# Patient Record
Sex: Female | Born: 1935 | Race: Black or African American | Hispanic: No | Marital: Single | Smoking: Never smoker
Health system: Southern US, Community
[De-identification: ages and names within clinical notes are randomized; demographics above are authoritative.]

## PROBLEM LIST (undated history)

## (undated) DIAGNOSIS — H53133 Sudden visual loss, bilateral: Secondary | ICD-10-CM

## (undated) DIAGNOSIS — M069 Rheumatoid arthritis, unspecified: Secondary | ICD-10-CM

## (undated) DIAGNOSIS — H409 Unspecified glaucoma: Secondary | ICD-10-CM

## (undated) DIAGNOSIS — I639 Cerebral infarction, unspecified: Secondary | ICD-10-CM

## (undated) DIAGNOSIS — I10 Essential (primary) hypertension: Secondary | ICD-10-CM

## (undated) DIAGNOSIS — I6522 Occlusion and stenosis of left carotid artery: Secondary | ICD-10-CM

## (undated) DIAGNOSIS — F419 Anxiety disorder, unspecified: Secondary | ICD-10-CM

## (undated) DIAGNOSIS — J40 Bronchitis, not specified as acute or chronic: Secondary | ICD-10-CM

## (undated) DIAGNOSIS — R42 Dizziness and giddiness: Secondary | ICD-10-CM

## (undated) DIAGNOSIS — I739 Peripheral vascular disease, unspecified: Secondary | ICD-10-CM

## (undated) DIAGNOSIS — E785 Hyperlipidemia, unspecified: Secondary | ICD-10-CM

## (undated) DIAGNOSIS — E079 Disorder of thyroid, unspecified: Secondary | ICD-10-CM

## (undated) DIAGNOSIS — J449 Chronic obstructive pulmonary disease, unspecified: Secondary | ICD-10-CM

## (undated) DIAGNOSIS — I6340 Cerebral infarction due to embolism of unspecified cerebral artery: Secondary | ICD-10-CM

## (undated) HISTORY — PX: ABDOMINAL HYSTERECTOMY: SHX81

## (undated) HISTORY — PX: BREAST SURGERY: SHX581

---

## 2001-05-27 ENCOUNTER — Encounter: Admission: RE | Admit: 2001-05-27 | Discharge: 2001-05-27 | Payer: Self-pay | Admitting: Internal Medicine

## 2001-05-27 ENCOUNTER — Encounter: Payer: Self-pay | Admitting: Internal Medicine

## 2001-08-26 ENCOUNTER — Encounter: Admission: RE | Admit: 2001-08-26 | Discharge: 2001-08-26 | Payer: Self-pay | Admitting: Internal Medicine

## 2001-08-26 ENCOUNTER — Encounter: Payer: Self-pay | Admitting: Internal Medicine

## 2002-11-19 ENCOUNTER — Encounter: Payer: Self-pay | Admitting: Internal Medicine

## 2002-11-19 ENCOUNTER — Encounter: Admission: RE | Admit: 2002-11-19 | Discharge: 2002-11-19 | Payer: Self-pay | Admitting: Internal Medicine

## 2003-12-09 ENCOUNTER — Encounter: Admission: RE | Admit: 2003-12-09 | Discharge: 2003-12-09 | Payer: Self-pay | Admitting: Internal Medicine

## 2005-01-23 ENCOUNTER — Encounter: Admission: RE | Admit: 2005-01-23 | Discharge: 2005-01-23 | Payer: Self-pay | Admitting: Internal Medicine

## 2006-02-18 ENCOUNTER — Emergency Department (HOSPITAL_COMMUNITY): Admission: EM | Admit: 2006-02-18 | Discharge: 2006-02-19 | Payer: Self-pay | Admitting: Emergency Medicine

## 2011-04-09 ENCOUNTER — Other Ambulatory Visit: Payer: Self-pay | Admitting: Internal Medicine

## 2011-06-06 ENCOUNTER — Ambulatory Visit (INDEPENDENT_AMBULATORY_CARE_PROVIDER_SITE_OTHER): Payer: Medicare Other | Admitting: Emergency Medicine

## 2011-06-06 DIAGNOSIS — I10 Essential (primary) hypertension: Secondary | ICD-10-CM

## 2011-06-06 DIAGNOSIS — E213 Hyperparathyroidism, unspecified: Secondary | ICD-10-CM

## 2011-06-06 DIAGNOSIS — E782 Mixed hyperlipidemia: Secondary | ICD-10-CM

## 2011-06-06 DIAGNOSIS — E785 Hyperlipidemia, unspecified: Secondary | ICD-10-CM

## 2011-06-06 DIAGNOSIS — D34 Benign neoplasm of thyroid gland: Secondary | ICD-10-CM

## 2011-06-06 LAB — POCT CBC
Granulocyte percent: 53.5 %G (ref 37–80)
HCT, POC: 41.9 % (ref 37.7–47.9)
Hemoglobin: 13.5 g/dL (ref 12.2–16.2)
Lymph, poc: 2.6 (ref 0.6–3.4)
MCH, POC: 27.6 pg (ref 27–31.2)
MCHC: 32.2 g/dL (ref 31.8–35.4)
MCV: 85.6 fL (ref 80–97)
MID (cbc): 0.4 (ref 0–0.9)
MPV: 9.3 fL (ref 0–99.8)
POC Granulocyte: 3.5 (ref 2–6.9)
POC LYMPH PERCENT: 39.6 %L (ref 10–50)
POC MID %: 6.9 %M (ref 0–12)
Platelet Count, POC: 201 10*3/uL (ref 142–424)
RBC: 4.89 M/uL (ref 4.04–5.48)
RDW, POC: 16.9 %
WBC: 6.5 10*3/uL (ref 4.6–10.2)

## 2011-06-06 MED ORDER — ATORVASTATIN CALCIUM 80 MG PO TABS: 80.0000 mg | ORAL_TABLET | Freq: Every day | ORAL | Status: DC

## 2011-06-06 MED ORDER — AMLODIPINE BESYLATE 10 MG PO TABS: 10.0000 mg | ORAL_TABLET | Freq: Every day | ORAL | Status: DC

## 2011-06-06 NOTE — Progress Notes (Signed)
  Subjective:    Patient ID: Stacie Bennett, female    DOB: [birth year], 78 y.o.   MRN: [id number]  HPI patient here to followup on her high blood pressure and high cholesterol. She is known to be hyperparathyroid. She was referred to Dr. Juliet Rude but did not have a good experience there. She would prefer to see a different endocrinologist. She states she feels good.    Review of Systems she denies chest pain shortness of breath bowel problems she states she feels fine     Objective:   Physical Exam  Constitutional: She appears well-developed.  HENT:  Head: Normocephalic.  Neck:       Examination of the neck reveals a prominent bony deformity over the manubrium extending to the left. 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  Cardiovascular: Normal rate, regular rhythm and normal heart sounds.  Exam reveals no gallop and no friction rub.   No murmur heard. Pulmonary/Chest: No respiratory distress. She has no wheezes. She has no rales. She exhibits no tenderness.  Abdominal: She exhibits no distension. There is no tenderness. There is no rebound.          Assessment & Plan:  Plan check routine labs. Medicines changed to atorvastatin 80 and amlodipine 10 to save her money. We'll go ahead and make the referral Dr. Everardo All for evaluation of hyperparathyroidism.

## 2011-06-07 ENCOUNTER — Encounter: Payer: Self-pay | Admitting: *Deleted

## 2011-06-07 LAB — COMPREHENSIVE METABOLIC PANEL
ALT: 14 U/L (ref 0–35)
AST: 22 U/L (ref 0–37)
Albumin: 4.3 g/dL (ref 3.5–5.2)
Alkaline Phosphatase: 162 U/L — ABNORMAL HIGH (ref 39–117)
BUN: 12 mg/dL (ref 6–23)
CO2: 28 mEq/L (ref 19–32)
Calcium: 10.5 mg/dL (ref 8.4–10.5)
Chloride: 107 mEq/L (ref 96–112)
Creat: 0.76 mg/dL (ref 0.50–1.10)
Glucose, Bld: 103 mg/dL — ABNORMAL HIGH (ref 70–99)
Potassium: 3.6 mEq/L (ref 3.5–5.3)
Sodium: 144 mEq/L (ref 135–145)
Total Bilirubin: 1.1 mg/dL (ref 0.3–1.2)
Total Protein: 7.1 g/dL (ref 6.0–8.3)

## 2011-06-07 LAB — LIPID PANEL
LDL Cholesterol: 129 mg/dL — ABNORMAL HIGH (ref 0–99)
Triglycerides: 69 mg/dL (ref <150)

## 2011-06-07 LAB — TSH: TSH: 0.861 u[IU]/mL (ref 0.350–4.500)

## 2011-07-01 ENCOUNTER — Ambulatory Visit: Payer: Self-pay | Admitting: Endocrinology

## 2012-06-14 ENCOUNTER — Other Ambulatory Visit: Payer: Self-pay | Admitting: Emergency Medicine

## 2012-07-13 ENCOUNTER — Other Ambulatory Visit: Payer: Self-pay | Admitting: Physician Assistant

## 2012-08-12 ENCOUNTER — Other Ambulatory Visit: Payer: Self-pay | Admitting: Physician Assistant

## 2012-08-24 ENCOUNTER — Other Ambulatory Visit: Payer: Self-pay | Admitting: Emergency Medicine

## 2012-10-02 ENCOUNTER — Other Ambulatory Visit: Payer: Self-pay | Admitting: Emergency Medicine

## 2012-10-02 NOTE — Telephone Encounter (Signed)
Pt was advised at last RF that OV was needed for more. She has appt set up for 01/26/13 but would need 4 RFs to cover her until then. Dr Cleta Alberts do you want to authorize RFs? Pt hasn't been seen since 06/06/11.

## 2012-10-03 MED ORDER — AMLODIPINE BESYLATE 10 MG PO TABS: ORAL_TABLET | ORAL | Status: DC

## 2012-10-03 NOTE — Addendum Note (Signed)
Addended by: Sheppard Plumber A on: 10/03/2012 11:06 AM   Modules accepted: Orders

## 2013-01-16 ENCOUNTER — Other Ambulatory Visit: Payer: Self-pay | Admitting: Emergency Medicine

## 2013-01-26 ENCOUNTER — Encounter: Payer: Medicare Other | Admitting: Emergency Medicine

## 2014-03-02 DIAGNOSIS — Z131 Encounter for screening for diabetes mellitus: Secondary | ICD-10-CM | POA: Diagnosis not present

## 2014-03-02 DIAGNOSIS — I10 Essential (primary) hypertension: Secondary | ICD-10-CM | POA: Diagnosis not present

## 2014-03-02 DIAGNOSIS — M069 Rheumatoid arthritis, unspecified: Secondary | ICD-10-CM | POA: Diagnosis not present

## 2014-03-02 DIAGNOSIS — E784 Other hyperlipidemia: Secondary | ICD-10-CM | POA: Diagnosis not present

## 2014-08-31 DIAGNOSIS — M069 Rheumatoid arthritis, unspecified: Secondary | ICD-10-CM | POA: Diagnosis not present

## 2014-08-31 DIAGNOSIS — E784 Other hyperlipidemia: Secondary | ICD-10-CM | POA: Diagnosis not present

## 2014-08-31 DIAGNOSIS — I10 Essential (primary) hypertension: Secondary | ICD-10-CM | POA: Diagnosis not present

## 2014-08-31 DIAGNOSIS — J42 Unspecified chronic bronchitis: Secondary | ICD-10-CM | POA: Diagnosis not present

## 2014-09-25 ENCOUNTER — Encounter (HOSPITAL_COMMUNITY): Payer: Self-pay

## 2014-09-25 ENCOUNTER — Emergency Department (HOSPITAL_COMMUNITY): Payer: Medicare Other

## 2014-09-25 ENCOUNTER — Emergency Department (HOSPITAL_COMMUNITY)
Admission: EM | Admit: 2014-09-25 | Discharge: 2014-09-26 | Discharge status: Against Medical Advice | Payer: Medicare Other | Attending: Emergency Medicine | Admitting: Emergency Medicine

## 2014-09-25 DIAGNOSIS — Z79899 Other long term (current) drug therapy: Secondary | ICD-10-CM | POA: Diagnosis not present

## 2014-09-25 DIAGNOSIS — J441 Chronic obstructive pulmonary disease with (acute) exacerbation: Secondary | ICD-10-CM | POA: Insufficient documentation

## 2014-09-25 DIAGNOSIS — R0602 Shortness of breath: Secondary | ICD-10-CM

## 2014-09-25 DIAGNOSIS — I10 Essential (primary) hypertension: Secondary | ICD-10-CM | POA: Diagnosis not present

## 2014-09-25 DIAGNOSIS — Z8611 Personal history of tuberculosis: Secondary | ICD-10-CM | POA: Insufficient documentation

## 2014-09-25 DIAGNOSIS — H409 Unspecified glaucoma: Secondary | ICD-10-CM | POA: Insufficient documentation

## 2014-09-25 HISTORY — DX: Essential (primary) hypertension: I10

## 2014-09-25 HISTORY — DX: Chronic obstructive pulmonary disease, unspecified: J44.9

## 2014-09-25 HISTORY — DX: Unspecified glaucoma: H40.9

## 2014-09-25 MED ORDER — ALBUTEROL SULFATE HFA 108 (90 BASE) MCG/ACT IN AERS: 2.0000 | INHALATION_SPRAY | Freq: Four times a day (QID) | RESPIRATORY_TRACT | Status: DC | PRN

## 2014-09-25 NOTE — ED Provider Notes (Signed)
CSN: [id number]     Arrival date & time 09/25/14  2102 History  This chart was scribed for Merryl Hacker, MD by Randa Evens, ED Scribe. This patient was seen in room A03C/A03C and the patient's care was started at 11:35 PM.      Chief Complaint  Patient presents with  . Shortness of Breath   The history is provided by the patient. No language interpreter was used.   HPI Comments: Stacie Bennett is a 81 y.o. female who presents to the Emergency Department complaining of resolved SOB onset 7 Pm tonight. She states that SOB lasted for about 5 minutes. She states that her right lung is collapsed. Pt states that she has a Hx of COPD. Denies any recent travel. Denies CP, cough, fever, leg swelling, n/v/d or other related symptoms. Denies tobacco use. Pt reports Hx of tuberculosis in her early 20's.   Past Medical History  Diagnosis Date  . Hypertension   . Glaucoma   . COPD (chronic obstructive pulmonary disease)    Past Surgical History  Procedure Laterality Date  . Breast surgery    . Abdominal hysterectomy     History reviewed. No pertinent family history. Social History  Substance Use Topics  . Smoking status: Never Smoker   . Smokeless tobacco: None  . Alcohol Use: No   OB History    No data available     Review of Systems  Constitutional: Negative for fever.  Respiratory: Positive for shortness of breath. Negative for cough and chest tightness.   Cardiovascular: Negative for chest pain and leg swelling.  Gastrointestinal: Negative for nausea, vomiting and abdominal pain.  Genitourinary: Negative for dysuria.  Neurological: Negative for headaches.  All other systems reviewed and are negative.     Allergies  Keflex and Pletal  Home Medications   Prior to Admission medications   Medication Sig Start Date End Date Taking? Authorizing Provider  albuterol (PROVENTIL HFA;VENTOLIN HFA) 108 (90 BASE) MCG/ACT inhaler Inhale 2 puffs into the lungs every 6 (six) hours  as needed for wheezing or shortness of breath. 09/25/14   Merryl Hacker, MD  amLODipine (NORVASC) 10 MG tablet TAKE 1 TABLET BY MOUTH EVERY DAY 10/03/12   Darlyne Russian, MD  amLODipine-atorvastatin (CADUET) 10-80 MG per tablet TAKE ONE TABLET BY MOUTH DAILY 04/09/11   Chelle Jeffery, PA-C  atorvastatin (LIPITOR) 80 MG tablet Take 1 tablet (80 mg total) by mouth daily. 06/06/11 06/05/12  Darlyne Russian, MD  latanoprost (XALATAN) 0.005 % ophthalmic solution 1 drop at bedtime.    Historical Provider, MD   BP 177/67 mmHg  Pulse 73  Temp(Src) 98.4 F (36.9 C) (Oral)  Resp 20  SpO2 97%   Physical Exam  Constitutional: She is oriented to person, place, and time. She appears well-developed. No distress.  elderly  HENT:  Head: Normocephalic and atraumatic.  Cardiovascular: Normal rate, regular rhythm and normal heart sounds.   No murmur heard. Pulmonary/Chest: Effort normal. No respiratory distress. She has no wheezes.  Decreased BS Left upper lobe, scarring noted over the left back  Abdominal: Soft. Bowel sounds are normal. There is no tenderness. There is no rebound.  Musculoskeletal: She exhibits no edema.  Neurological: She is alert and oriented to person, place, and time.  Skin: Skin is warm and dry.  Psychiatric: She has a normal mood and affect.  Nursing note and vitals reviewed.   ED Course  Procedures (including critical care time) DIAGNOSTIC STUDIES: Oxygen Saturation is  97% on RA, normal by my interpretation.    COORDINATION OF CARE: 11:49 PM-Discussed treatment plan with pt at bedside and pt agreed to plan.     Labs Review Labs Reviewed  CBC WITH DIFFERENTIAL/PLATELET  BASIC METABOLIC PANEL  TROPONIN I    Imaging Review Dg Chest 2 View  09/25/2014   CLINICAL DATA:  Short of breath beginning tonight lasting for 10 minutes. Tuberculosis at age 28.  EXAM: CHEST  2 VIEW  COMPARISON:  None.  FINDINGS: Examination demonstrates bony deformity of the upper left thorax which may  be related to patient's remote history of tuberculosis treatment. Remainder of the left lung is clear. Right lung is clear. Cardiac silhouette is within normal. There is increased density in the upper right paramediastinal region which may represent a goiter. There are degenerative changes of the spine with mild biphasic curvature of the thoracolumbar spine.  IMPRESSION: No active cardiopulmonary disease.  Chronic bony deformity of the left upper thorax likely related to patient's remote history of treatment for TB.  Increased density over the upper right paramediastinal region likely related to a goiter. Recommend noncontrast chest CT for further evaluation.   Electronically Signed   By: Marin Olp M.D.   On: 09/25/2014 22:04   I have personally reviewed and evaluated these images and lab results as part of my medical decision-making.   EKG Interpretation   Date/Time:  Sunday September 25 2014 21:15:36 EDT Ventricular Rate:  80 PR Interval:  142 QRS Duration: 74 QT Interval:  358 QTC Calculation: 412 R Axis:   63 Text Interpretation:  Normal sinus rhythm Nonspecific T wave abnormality  Abnormal ECG Confirmed by Jessina Marse  MD, Illya Gienger ([id number]) on 09/25/2014  11:32:44 PM      MDM   Final diagnoses:  Shortness of breath    Patient presents with shortness breath. Self-limited and resolved. Patient and her family are upset that she's been waiting for greater than 4 hours. I apologized and discuss with them that the EKG and chest x-ray are initially reassuring. Chest x-ray does show a capacity that needs further CT scan. I discussed with them further workup including troponin to evaluate for heart disease. They do not wish to stay. They're just requesting an inhaler. Currently she is not wheezing and is at her baseline. O2 sats 97% on room air. Patient states understanding that if she leaves without further workup, she has not been fully evaluated for heart strain, PE or any other acute condition.  She understands the risk and benefits including death. She will sign out [place]. Follow-up with primary physician. Patient and family were given a printout of her chest x-ray in order for her to have a CT follow-up.  I personally performed the services described in this documentation, which was scribed in my presence. The recorded information has been reviewed and is accurate.      Merryl Hacker, MD 09/26/14 [phone]

## 2014-09-25 NOTE — ED Notes (Signed)
Onset 7pm pt got shortness of breath that lasted 10 min.  No shortness of breath, chest pain or any other s/s noted at this time.

## 2014-09-26 NOTE — ED Notes (Signed)
Patient left AMA. Son reported that patient unwilling "to wait 2 more hours for blood work." Prescription provided to patient/family for inhaler. Patient transported from ED in wheelchair. No distress noted at time of discharge.

## 2014-10-26 DIAGNOSIS — H43811 Vitreous degeneration, right eye: Secondary | ICD-10-CM | POA: Diagnosis not present

## 2014-10-26 DIAGNOSIS — H4089 Other specified glaucoma: Secondary | ICD-10-CM | POA: Diagnosis not present

## 2014-10-26 DIAGNOSIS — H2511 Age-related nuclear cataract, right eye: Secondary | ICD-10-CM | POA: Diagnosis not present

## 2014-10-26 DIAGNOSIS — H538 Other visual disturbances: Secondary | ICD-10-CM | POA: Diagnosis not present

## 2014-10-28 DIAGNOSIS — H35363 Drusen (degenerative) of macula, bilateral: Secondary | ICD-10-CM | POA: Diagnosis not present

## 2014-11-01 ENCOUNTER — Encounter: Payer: Self-pay | Admitting: Emergency Medicine

## 2014-11-21 DIAGNOSIS — H18413 Arcus senilis, bilateral: Secondary | ICD-10-CM | POA: Diagnosis not present

## 2014-11-21 DIAGNOSIS — H2511 Age-related nuclear cataract, right eye: Secondary | ICD-10-CM | POA: Diagnosis not present

## 2014-11-21 DIAGNOSIS — H401130 Primary open-angle glaucoma, bilateral, stage unspecified: Secondary | ICD-10-CM | POA: Diagnosis not present

## 2014-11-21 DIAGNOSIS — H25011 Cortical age-related cataract, right eye: Secondary | ICD-10-CM | POA: Diagnosis not present

## 2014-11-21 DIAGNOSIS — H4010X0 Unspecified open-angle glaucoma, stage unspecified: Secondary | ICD-10-CM | POA: Diagnosis not present

## 2014-12-01 DIAGNOSIS — E784 Other hyperlipidemia: Secondary | ICD-10-CM | POA: Diagnosis not present

## 2014-12-01 DIAGNOSIS — J42 Unspecified chronic bronchitis: Secondary | ICD-10-CM | POA: Diagnosis not present

## 2014-12-01 DIAGNOSIS — H409 Unspecified glaucoma: Secondary | ICD-10-CM | POA: Diagnosis not present

## 2014-12-01 DIAGNOSIS — I10 Essential (primary) hypertension: Secondary | ICD-10-CM | POA: Diagnosis not present

## 2015-08-10 ENCOUNTER — Other Ambulatory Visit: Payer: Self-pay | Admitting: Internal Medicine

## 2015-08-10 DIAGNOSIS — E2839 Other primary ovarian failure: Secondary | ICD-10-CM

## 2015-08-21 ENCOUNTER — Ambulatory Visit
Admission: RE | Admit: 2015-08-21 | Discharge: 2015-08-21 | Disposition: A | Discharge status: Home / Self Care | Payer: Medicare Other | Source: Ambulatory Visit | Attending: Internal Medicine | Admitting: Internal Medicine

## 2015-08-21 DIAGNOSIS — E2839 Other primary ovarian failure: Secondary | ICD-10-CM

## 2017-06-16 ENCOUNTER — Observation Stay (HOSPITAL_COMMUNITY): Payer: Medicare Other

## 2017-06-16 ENCOUNTER — Encounter (HOSPITAL_COMMUNITY): Payer: Self-pay

## 2017-06-16 ENCOUNTER — Emergency Department (HOSPITAL_COMMUNITY): Payer: Medicare Other

## 2017-06-16 ENCOUNTER — Inpatient Hospital Stay (HOSPITAL_COMMUNITY)
Admission: EM | Admit: 2017-06-16 | Discharge: 2017-06-19 | DRG: 065 | Disposition: A | Discharge status: Home Health | Payer: Medicare Other | Attending: Internal Medicine | Admitting: Internal Medicine

## 2017-06-16 DIAGNOSIS — Z888 Allergy status to other drugs, medicaments and biological substances status: Secondary | ICD-10-CM

## 2017-06-16 DIAGNOSIS — Z90710 Acquired absence of both cervix and uterus: Secondary | ICD-10-CM

## 2017-06-16 DIAGNOSIS — R229 Localized swelling, mass and lump, unspecified: Secondary | ICD-10-CM

## 2017-06-16 DIAGNOSIS — I63412 Cerebral infarction due to embolism of left middle cerebral artery: Secondary | ICD-10-CM

## 2017-06-16 DIAGNOSIS — H543 Unqualified visual loss, both eyes: Secondary | ICD-10-CM | POA: Diagnosis not present

## 2017-06-16 DIAGNOSIS — I10 Essential (primary) hypertension: Secondary | ICD-10-CM | POA: Diagnosis not present

## 2017-06-16 DIAGNOSIS — I6523 Occlusion and stenosis of bilateral carotid arteries: Secondary | ICD-10-CM | POA: Diagnosis present

## 2017-06-16 DIAGNOSIS — R29702 NIHSS score 2: Secondary | ICD-10-CM | POA: Diagnosis present

## 2017-06-16 DIAGNOSIS — F0390 Unspecified dementia without behavioral disturbance: Secondary | ICD-10-CM | POA: Diagnosis present

## 2017-06-16 DIAGNOSIS — I63532 Cerebral infarction due to unspecified occlusion or stenosis of left posterior cerebral artery: Principal | ICD-10-CM | POA: Diagnosis present

## 2017-06-16 DIAGNOSIS — Z8249 Family history of ischemic heart disease and other diseases of the circulatory system: Secondary | ICD-10-CM

## 2017-06-16 DIAGNOSIS — I160 Hypertensive urgency: Secondary | ICD-10-CM | POA: Diagnosis present

## 2017-06-16 DIAGNOSIS — G9340 Encephalopathy, unspecified: Secondary | ICD-10-CM | POA: Diagnosis present

## 2017-06-16 DIAGNOSIS — Z79899 Other long term (current) drug therapy: Secondary | ICD-10-CM

## 2017-06-16 DIAGNOSIS — H53133 Sudden visual loss, bilateral: Secondary | ICD-10-CM | POA: Diagnosis present

## 2017-06-16 DIAGNOSIS — I6522 Occlusion and stenosis of left carotid artery: Secondary | ICD-10-CM | POA: Diagnosis present

## 2017-06-16 DIAGNOSIS — R29706 NIHSS score 6: Secondary | ICD-10-CM | POA: Diagnosis not present

## 2017-06-16 DIAGNOSIS — E785 Hyperlipidemia, unspecified: Secondary | ICD-10-CM | POA: Diagnosis present

## 2017-06-16 DIAGNOSIS — R7989 Other specified abnormal findings of blood chemistry: Secondary | ICD-10-CM

## 2017-06-16 DIAGNOSIS — R29705 NIHSS score 5: Secondary | ICD-10-CM | POA: Diagnosis not present

## 2017-06-16 DIAGNOSIS — F40240 Claustrophobia: Secondary | ICD-10-CM | POA: Diagnosis present

## 2017-06-16 DIAGNOSIS — M069 Rheumatoid arthritis, unspecified: Secondary | ICD-10-CM | POA: Diagnosis present

## 2017-06-16 DIAGNOSIS — Z881 Allergy status to other antibiotic agents status: Secondary | ICD-10-CM

## 2017-06-16 DIAGNOSIS — H547 Unspecified visual loss: Secondary | ICD-10-CM

## 2017-06-16 DIAGNOSIS — I639 Cerebral infarction, unspecified: Secondary | ICD-10-CM | POA: Diagnosis present

## 2017-06-16 DIAGNOSIS — I6340 Cerebral infarction due to embolism of unspecified cerebral artery: Secondary | ICD-10-CM | POA: Diagnosis present

## 2017-06-16 DIAGNOSIS — Z8673 Personal history of transient ischemic attack (TIA), and cerebral infarction without residual deficits: Secondary | ICD-10-CM

## 2017-06-16 DIAGNOSIS — I739 Peripheral vascular disease, unspecified: Secondary | ICD-10-CM | POA: Diagnosis present

## 2017-06-16 DIAGNOSIS — Z6831 Body mass index (BMI) 31.0-31.9, adult: Secondary | ICD-10-CM

## 2017-06-16 DIAGNOSIS — E079 Disorder of thyroid, unspecified: Secondary | ICD-10-CM | POA: Diagnosis present

## 2017-06-16 DIAGNOSIS — J449 Chronic obstructive pulmonary disease, unspecified: Secondary | ICD-10-CM | POA: Diagnosis present

## 2017-06-16 DIAGNOSIS — H53123 Transient visual loss, bilateral: Secondary | ICD-10-CM | POA: Diagnosis present

## 2017-06-16 DIAGNOSIS — H409 Unspecified glaucoma: Secondary | ICD-10-CM | POA: Diagnosis present

## 2017-06-16 DIAGNOSIS — IMO0002 Reserved for concepts with insufficient information to code with codable children: Secondary | ICD-10-CM

## 2017-06-16 DIAGNOSIS — E669 Obesity, unspecified: Secondary | ICD-10-CM | POA: Diagnosis present

## 2017-06-16 HISTORY — DX: Dizziness and giddiness: R42

## 2017-06-16 HISTORY — DX: Occlusion and stenosis of left carotid artery: I65.22

## 2017-06-16 HISTORY — DX: Sudden visual loss, bilateral: H53.133

## 2017-06-16 HISTORY — DX: Hyperlipidemia, unspecified: E78.5

## 2017-06-16 HISTORY — DX: Rheumatoid arthritis, unspecified: M06.9

## 2017-06-16 HISTORY — DX: Anxiety disorder, unspecified: F41.9

## 2017-06-16 HISTORY — DX: Cerebral infarction, unspecified: I63.9

## 2017-06-16 HISTORY — DX: Peripheral vascular disease, unspecified: I73.9

## 2017-06-16 HISTORY — DX: Bronchitis, not specified as acute or chronic: J40

## 2017-06-16 HISTORY — DX: Disorder of thyroid, unspecified: E07.9

## 2017-06-16 HISTORY — DX: Cerebral infarction due to embolism of unspecified cerebral artery: I63.40

## 2017-06-16 LAB — CBC
HCT: 46.5 % — ABNORMAL HIGH (ref 36.0–46.0)
HEMOGLOBIN: 14.5 g/dL (ref 12.0–15.0)
MCH: 27 pg (ref 26.0–34.0)
MCHC: 31.2 g/dL (ref 30.0–36.0)
MCV: 86.6 fL (ref 78.0–100.0)
Platelets: 196 10*3/uL (ref 150–400)
RBC: 5.37 MIL/uL — ABNORMAL HIGH (ref 3.87–5.11)
RDW: 14.3 % (ref 11.5–15.5)
WBC: 6.9 10*3/uL (ref 4.0–10.5)

## 2017-06-16 LAB — COMPREHENSIVE METABOLIC PANEL
ALBUMIN: 3.9 g/dL (ref 3.5–5.0)
ALT: 13 U/L — ABNORMAL LOW (ref 14–54)
AST: 17 U/L (ref 15–41)
Alkaline Phosphatase: 148 U/L — ABNORMAL HIGH (ref 38–126)
Anion gap: 8 (ref 5–15)
BUN: 13 mg/dL (ref 6–20)
CALCIUM: 10.5 mg/dL — AB (ref 8.9–10.3)
CHLORIDE: 105 mmol/L (ref 101–111)
CO2: 27 mmol/L (ref 22–32)
Creatinine, Ser: 1.09 mg/dL — ABNORMAL HIGH (ref 0.44–1.00)
GFR calc non Af Amer: 44 mL/min — ABNORMAL LOW (ref >60)
GFR, EST AFRICAN AMERICAN: 51 mL/min — AB (ref >60)
GLUCOSE: 95 mg/dL (ref 65–99)
POTASSIUM: 4.4 mmol/L (ref 3.5–5.1)
SODIUM: 140 mmol/L (ref 135–145)
Total Bilirubin: 0.9 mg/dL (ref 0.3–1.2)
Total Protein: 7.1 g/dL (ref 6.5–8.1)

## 2017-06-16 LAB — DIFFERENTIAL
Abs Immature Granulocytes: 0 10*3/uL (ref 0.0–0.1)
BASOS ABS: 0 10*3/uL (ref 0.0–0.1)
BASOS PCT: 0 %
EOS ABS: 0.1 10*3/uL (ref 0.0–0.7)
EOS PCT: 2 %
IMMATURE GRANULOCYTES: 0 %
Lymphocytes Relative: 33 %
Lymphs Abs: 2.3 10*3/uL (ref 0.7–4.0)
MONO ABS: 0.6 10*3/uL (ref 0.1–1.0)
Monocytes Relative: 8 %
NEUTROS PCT: 57 %
Neutro Abs: 3.9 10*3/uL (ref 1.7–7.7)

## 2017-06-16 LAB — I-STAT CHEM 8, ED
BUN: 16 mg/dL (ref 6–20)
Calcium, Ion: 1.37 mmol/L (ref 1.15–1.40)
Chloride: 104 mmol/L (ref 101–111)
Creatinine, Ser: 1 mg/dL (ref 0.44–1.00)
Glucose, Bld: 97 mg/dL (ref 65–99)
HCT: 45 % (ref 36.0–46.0)
Hemoglobin: 15.3 g/dL — ABNORMAL HIGH (ref 12.0–15.0)
Potassium: 4.4 mmol/L (ref 3.5–5.1)
SODIUM: 141 mmol/L (ref 135–145)
TCO2: 28 mmol/L (ref 22–32)

## 2017-06-16 LAB — APTT: APTT: 37 s — AB (ref 24–36)

## 2017-06-16 LAB — I-STAT TROPONIN, ED: Troponin i, poc: 0 ng/mL (ref 0.00–0.08)

## 2017-06-16 LAB — CBG MONITORING, ED: GLUCOSE-CAPILLARY: 89 mg/dL (ref 65–99)

## 2017-06-16 LAB — PROTIME-INR
INR: 1.06
Prothrombin Time: 13.7 seconds (ref 11.4–15.2)

## 2017-06-16 LAB — C-REACTIVE PROTEIN

## 2017-06-16 MED ORDER — SODIUM CHLORIDE 0.9 % IV SOLN: INTRAVENOUS | Status: AC

## 2017-06-16 MED ORDER — SENNOSIDES-DOCUSATE SODIUM 8.6-50 MG PO TABS
1.0000 | ORAL_TABLET | Freq: Every evening | ORAL | Status: DC | PRN
  Administered 2017-06-18 (×2): 1 via ORAL
  Filled 2017-06-16 (×2): qty 1

## 2017-06-16 MED ORDER — LABETALOL HCL 5 MG/ML IV SOLN: 5.0000 mg | INTRAVENOUS | Status: DC | PRN

## 2017-06-16 MED ORDER — LORAZEPAM 1 MG PO TABS: 1.0000 mg | ORAL_TABLET | Freq: Once | ORAL | Status: DC

## 2017-06-16 MED ORDER — ASPIRIN 300 MG RE SUPP
300.0000 mg | Freq: Every day | RECTAL | Status: DC
  Administered 2017-06-17: 300 mg via RECTAL
  Filled 2017-06-16: qty 1

## 2017-06-16 MED ORDER — ACETAMINOPHEN 325 MG PO TABS
650.0000 mg | ORAL_TABLET | ORAL | Status: DC | PRN
  Filled 2017-06-16: qty 2

## 2017-06-16 MED ORDER — ACETAMINOPHEN 160 MG/5ML PO SOLN: 650.0000 mg | ORAL | Status: DC | PRN

## 2017-06-16 MED ORDER — MOMETASONE FURO-FORMOTEROL FUM 100-5 MCG/ACT IN AERO
2.0000 | INHALATION_SPRAY | Freq: Every day | RESPIRATORY_TRACT | Status: DC
  Administered 2017-06-18 – 2017-06-19 (×2): 2 via RESPIRATORY_TRACT
  Filled 2017-06-16: qty 8.8

## 2017-06-16 MED ORDER — ENOXAPARIN SODIUM 40 MG/0.4ML ~~LOC~~ SOLN
40.0000 mg | SUBCUTANEOUS | Status: DC
  Administered 2017-06-17 – 2017-06-19 (×3): 40 mg via SUBCUTANEOUS
  Filled 2017-06-16 (×3): qty 0.4

## 2017-06-16 MED ORDER — ASPIRIN 325 MG PO TABS
325.0000 mg | ORAL_TABLET | Freq: Every day | ORAL | Status: DC
  Administered 2017-06-18: 325 mg via ORAL
  Filled 2017-06-16: qty 1

## 2017-06-16 MED ORDER — ALBUTEROL SULFATE (2.5 MG/3ML) 0.083% IN NEBU: 3.0000 mL | INHALATION_SOLUTION | Freq: Four times a day (QID) | RESPIRATORY_TRACT | Status: DC | PRN

## 2017-06-16 MED ORDER — STROKE: EARLY STAGES OF RECOVERY BOOK
Freq: Once | Status: AC
  Administered 2017-06-16
  Filled 2017-06-16: qty 1

## 2017-06-16 MED ORDER — LATANOPROST 0.005 % OP SOLN
1.0000 [drp] | Freq: Every day | OPHTHALMIC | Status: DC
  Administered 2017-06-17 – 2017-06-18 (×2): 1 [drp] via OPHTHALMIC
  Filled 2017-06-16: qty 2.5

## 2017-06-16 MED ORDER — MECLIZINE HCL 12.5 MG PO TABS: 25.0000 mg | ORAL_TABLET | Freq: Three times a day (TID) | ORAL | Status: DC | PRN

## 2017-06-16 MED ORDER — ACETAMINOPHEN 650 MG RE SUPP: 650.0000 mg | RECTAL | Status: DC | PRN

## 2017-06-16 MED ORDER — LORAZEPAM 2 MG/ML IJ SOLN
1.0000 mg | Freq: Once | INTRAMUSCULAR | Status: AC
  Administered 2017-06-16: 1 mg via INTRAVENOUS
  Filled 2017-06-16: qty 1

## 2017-06-16 NOTE — ED Triage Notes (Signed)
PT sent in from ophthalmologist office for stroke work up d/t sudden vision loss in bilateral eyes Friday night. Pt is able to see outline of object if ~ 2 feet in front of her, but can not see anything past that.  Pt son reports headaches for the last couple days, but none currently. No unilateral weakness, slurred speech, facial droop.

## 2017-06-16 NOTE — H&P (Addendum)
History and Physical    Stacie Bennett KGY:[id number] DOB: [birth year] DOA: 06/16/2017  PCP: Darlyne Russian, MD   Patient coming from: Home, by way of ophthalmology clinic  Chief Complaint: Vision loss   HPI: Stacie Bennett is a 84 y.o. female with medical history significant for COPD, cataracts, glaucoma, and hypertension, now presenting to the emergency department for evaluation of bilateral vision loss.  Patient reports that she been in her usual state of health until 06/13/2017 when she developed a mild headache and vision loss involving the bilateral eyes.  Symptoms resolved and she returned to her usual state until yesterday when the vision loss returned.  She denies trauma, eye pain, eye discharge or redness, or similar symptoms previously.  She no longer has a headache and denies any focal numbness or weakness.  She saw her ophthalmologist today for evaluation, reports that her cataracts and glaucoma were said to be stable, and was directed to the ED with concern for stroke.  ED Course: Upon arrival to the ED, patient is found to be afebrile, saturating well on room air, and with vitals otherwise stable.  EKG features a normal sinus rhythm and noncontrast head CT is negative for acute intracranial abnormality.  Chemistry panel features a serum creatinine 1.09 and CBC is unremarkable.  Troponin is undetectable.  MRI brain was ordered but not yet performed and neurology was consulted by the ED physician.  Patient remains hemodynamically stable and will be observed on the telemetry unit for ongoing evaluation and management of bilateral vision loss with concern for possible GCA or stroke.  Review of Systems:  All other systems reviewed and apart from HPI, are negative.  Past Medical History:  Diagnosis Date  . Anxiety   . Bronchitis   . COPD (chronic obstructive pulmonary disease) ([place])   . Glaucoma   . Hyperlipidemia   . Hypertension   . Peripheral vascular disease ([place])   . Rheumatoid  arthritis ([place])   . Vertigo     Past Surgical History:  Procedure Laterality Date  . ABDOMINAL HYSTERECTOMY    . BREAST SURGERY       reports that she has never smoked. She has never used smokeless tobacco. She reports that she does not drink alcohol or use drugs.  Allergies  Allergen Reactions  . Keflex [Cephalexin] Other (See Comments)    Unknown reaction  . Pletal [Cilostazol] Other (See Comments)    Unknown reaction    Family History  Problem Relation Age of Onset  . Hypertension Other      Prior to Admission medications   Medication Sig Start Date End Date Taking? Authorizing Provider  acetaminophen (TYLENOL) 500 MG tablet Take 1,000 mg by mouth every 6 (six) hours as needed for headache.   Yes [provider]  albuterol (PROVENTIL HFA;VENTOLIN HFA) 108 (90 BASE) MCG/ACT inhaler Inhale 2 puffs into the lungs every 6 (six) hours as needed for wheezing or shortness of breath. 09/25/14  Yes Horton, Barbette Hair, MD  amLODipine (NORVASC) 5 MG tablet Take 5 mg by mouth daily. 05/09/17  Yes [provider]  latanoprost (XALATAN) 0.005 % ophthalmic solution Place 1 drop into both eyes at bedtime.    Yes [provider]  meclizine (ANTIVERT) 25 MG tablet Take 25 mg by mouth every 8 (eight) hours as needed for dizziness.   Yes [provider]  mometasone-formoterol (DULERA) 100-5 MCG/ACT AERO Inhale 2 puffs into the lungs daily.   Yes [provider]  Physical Exam: Vitals:   06/16/17 1850 06/16/17 1852 06/16/17 1900 06/16/17 1915  BP: (!) 168/97  (!) 190/80 (!) 177/68  Pulse:  61 62 63  Resp:  '20 20 20  '$ Temp:      TempSrc:      SpO2:  100% 99% 99%      Constitutional: NAD, calm  Eyes: PERTLA, lids and conjunctivae normal ENMT: Mucous membranes are moist. Posterior pharynx clear of any exudate or lesions.   Neck: normal, supple, no masses, no thyromegaly Respiratory: clear to auscultation bilaterally, no wheezing, no  crackles. Normal respiratory effort.    Cardiovascular: S1 & S2 heard, regular rate and rhythm. No extremity edema.  Abdomen: No distension, no tenderness, soft. Bowel sounds normal.  Musculoskeletal: no clubbing / cyanosis. No joint deformity upper and lower extremities.    Skin: no significant rashes, lesions, ulcers. Warm, dry, well-perfused. Neurologic: No facial asymmetry, gross vision loss bilateral. Sensation to light touch intact, patellar DTR normal. Strength 5/5 in all 4 limbs.  Psychiatric:  Alert and oriented to person, place, and situation. Pleasant and cooperative.     Labs on Admission: I have personally reviewed following labs and imaging studies  CBC: Recent Labs  Lab 06/16/17 1633 06/16/17 1706  WBC 6.9  --   NEUTROABS 3.9  --   HGB 14.5 15.3*  HCT 46.5* 45.0  MCV 86.6  --   PLT 196  --    Basic Metabolic Panel: Recent Labs  Lab 06/16/17 1633 06/16/17 1706  NA 140 141  K 4.4 4.4  CL 105 104  CO2 27  --   GLUCOSE 95 97  BUN 13 16  CREATININE 1.09* 1.00  CALCIUM 10.5*  --    GFR: CrCl cannot be calculated (Unknown ideal weight.). Liver Function Tests: Recent Labs  Lab 06/16/17 1633  AST 17  ALT 13*  ALKPHOS 148*  BILITOT 0.9  PROT 7.1  ALBUMIN 3.9   No results for input(s): LIPASE, AMYLASE in the last 168 hours. No results for input(s): AMMONIA in the last 168 hours. Coagulation Profile: Recent Labs  Lab 06/16/17 1633  INR 1.06   Cardiac Enzymes: No results for input(s): CKTOTAL, CKMB, CKMBINDEX, TROPONINI in the last 168 hours. BNP (last 3 results) No results for input(s): PROBNP in the last 8760 hours. HbA1C: No results for input(s): HGBA1C in the last 72 hours. CBG: Recent Labs  Lab 06/16/17 1902  GLUCAP 89   Lipid Profile: No results for input(s): CHOL, HDL, LDLCALC, TRIG, CHOLHDL, LDLDIRECT in the last 72 hours. Thyroid Function Tests: No results for input(s): TSH, T4TOTAL, FREET4, T3FREE, THYROIDAB in the last 72  hours. Anemia Panel: No results for input(s): VITAMINB12, FOLATE, FERRITIN, TIBC, IRON, RETICCTPCT in the last 72 hours. Urine analysis: No results found for: COLORURINE, APPEARANCEUR, LABSPEC, PHURINE, GLUCOSEU, HGBUR, BILIRUBINUR, KETONESUR, PROTEINUR, UROBILINOGEN, NITRITE, LEUKOCYTESUR Sepsis Labs: '@LABRCNTIP'$ (procalcitonin:4,lacticidven:4) )No results found for this or any previous visit (from the past 240 hour(s)).   Radiological Exams on Admission: Ct Head Wo Contrast  Result Date: 06/16/2017 CLINICAL DATA:  84 year old with focal neuro deficit. Sudden visual loss in both eyes. EXAM: CT HEAD WITHOUT CONTRAST TECHNIQUE: Contiguous axial images were obtained from the base of the skull through the vertex without intravenous contrast. COMPARISON:  None. FINDINGS: Brain: Negative for acute hemorrhage, mass lesion, midline shift, hydrocephalus or new large infarct. Atrophy and encephalomalacia in the right temporal lobe. Focal encephalomalacia involving the right parietal and right occipital lobe. Mild ex vacuo dilatation of the  right temporal horn. Vascular: No hyperdense vessel or unexpected calcification. Skull: Normal. Negative for fracture or focal lesion. Sinuses/Orbits: Negative Other: Evidence for a scalp lipoma along the left lateral forehead. IMPRESSION: No acute intracranial abnormality. 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 Electronically Signed   By: Markus Daft M.D.   On: 06/16/2017 17:58    EKG: Independently reviewed. Normal sinus rhythm.   Assessment/Plan   1. Acute bilateral vision loss  - Presents with bilateral vision, had transient vision loss 5/17, but recurred yesterday and has been persistent  - She saw ophthalmology just prior to arrival, reports that cataracts and glaucoma are stable, sent to ED with concern for CVA or GCA - Head CT negative for acute findings  - Continue cardiac monitoring, check MRI brain, MRA head, carotid dopplers, echocardiogram, CRP, ESR, fasting lipids,  and A1c  - Continue frequent neuro checks, PT/OT/SLP eval  - Start ASA    2. COPD  - No wheezing or dyspnea  - Continue ICS/LABA and prn albuterol    3. Hypertension with hypertensive urgency  - BP elevated to 180/100 range in ED  - Hold Norvasc while evaluating for possible acute ischemic CVA, use labetalol IVP's prn    DVT prophylaxis: Lovenox Code Status: Full  Family Communication: Son updated at bedside Consults called: Neurology Admission status: Observation     Vianne Bulls, MD [facility] Pager [phone]  If 7PM-7AM, please contact night-coverage [email or web address] Password [email or web address]  06/16/2017, 9:03 PM

## 2017-06-16 NOTE — Consult Note (Addendum)
Neurology Consultation  Reason for Consult: Blindness both eyes-acute onset Referring Physician: Dr. Myna Hidalgo  CC: Sudden onset bilateral vision loss  History is obtained from: Chart, patient's son at bedside  HPI: Stacie Bennett is a 84 y.o. female past medical history of anxiety, hypertension, hyperlipidemia, peripheral vascular disease, rheumatoid arthritis, moderate dementia per family, was in usual state of health until Friday when she started complaining of a headache all around her head followed by inability to see objects.  The son says that she could not see his face even when it was 2 feet from her.  Patient has since been unable to see anything and do her activities of daily living.  At baseline she is able to navigate herself in the house without a problem and is able to play solitaire on the computer, watch TV and read newspaper without a problem. She was taken to an ophthalmologist, who did not find any acute problems on the eye exam and recommended stroke work-up to evaluate for bilateral posterior circulation strokes. The patient was unable to tolerate the MRI due to severe claustrophobia.  Next line she was very uncooperative during the exam, and the son said that she was very tired from being from the doctor to the ER to the floor all throughout today. She did not really let me examine her completely. Some denied jaw claudication.   LKW: Friday, 06/13/2017 tpa given?: no, unclear if this is a stroke, even if it is, it is outside the window Premorbid modified Rankin scale (mRS): *2  ROS: Patient refused to provide any review of systems  Past Medical History:  Diagnosis Date  . Anxiety   . Bronchitis   . COPD (chronic obstructive pulmonary disease) ([place])   . Glaucoma   . Hyperlipidemia   . Hypertension   . Peripheral vascular disease ([place])   . Rheumatoid arthritis ([place])   . Vertigo      Family History  Problem Relation Age of Onset  . Hypertension Other    social  History:   reports that she has never smoked. She has never used smokeless tobacco. She reports that she does not drink alcohol or use drugs.  Medications  Current Facility-Administered Medications:  .   stroke: mapping our early stages of recovery book, , Does not apply, Once, Opyd, Timothy S, MD .  0.9 %  sodium chloride infusion, , Intravenous, Continuous, Opyd, Ilene Qua, MD .  acetaminophen (TYLENOL) tablet 650 mg, 650 mg, Oral, Q4H PRN **OR** acetaminophen (TYLENOL) solution 650 mg, 650 mg, Per Tube, Q4H PRN **OR** acetaminophen (TYLENOL) suppository 650 mg, 650 mg, Rectal, Q4H PRN, Opyd, Timothy S, MD .  albuterol (PROVENTIL) (2.5 MG/3ML) 0.083% nebulizer solution 3 mL, 3 mL, Inhalation, Q6H PRN, Opyd, Timothy S, MD .  aspirin suppository 300 mg, 300 mg, Rectal, Daily **OR** aspirin tablet 325 mg, 325 mg, Oral, Daily, Opyd, Ilene Qua, MD .  Derrill Memo ON 06/17/2017] enoxaparin (LOVENOX) injection 40 mg, 40 mg, Subcutaneous, Q24H, Opyd, Timothy S, MD .  labetalol (NORMODYNE,TRANDATE) injection 5 mg, 5 mg, Intravenous, Q2H PRN, Opyd, Timothy S, MD .  latanoprost (XALATAN) 0.005 % ophthalmic solution 1 drop, 1 drop, Both Eyes, QHS, Opyd, Timothy S, MD .  meclizine (ANTIVERT) tablet 25 mg, 25 mg, Oral, Q8H PRN, Opyd, Ilene Qua, MD .  Derrill Memo ON 06/17/2017] mometasone-formoterol (DULERA) 100-5 MCG/ACT inhaler 2 puff, 2 puff, Inhalation, Daily, Opyd, Timothy S, MD .  senna-docusate (Senokot-S) tablet 1 tablet, 1 tablet, Oral, QHS PRN, Opyd, Christia Reading  S, MD  Exam: Current vital signs: BP (!) 190/62   Pulse 65   Temp 98.6 F (37 C) (Oral)   Resp 19   Wt 70.4 kg (155 lb 3.3 oz)   SpO2 99%   BMI 31.35 kg/m  Vital signs in last 24 hours: Temp:  [98.6 F (37 C)] 98.6 F (37 C) (05/20 1624) Pulse Rate:  [61-72] 65 (05/20 2000) Resp:  [16-20] 19 (05/20 2000) BP: (168-190)/(62-97) 190/62 (05/20 2000) SpO2:  [98 %-100 %] 99 % (05/20 2000) Weight:  [70.4 kg (155 lb 3.3 oz)] 70.4 kg (155 lb 3.3 oz)  (05/20 2230) General: Patient comfortably sleeping in bed, no acute distress. Gets agitated and aggressive on questioning. H ENT: Normocephalic, atraumatic, clear nares.  On palpating her head she said it hurts everywhere. CVS: Regular rate and rhythm, did not let me auscultate her. Extremities: Warm well perfused Neurological exam Next line patient was sleeping, easily arousable to voice, very agitated and refused to answer questions initially but then did answer some questions. Her speech is clear. She does not know where she has On asking if she had any problems, she said it hurts everywhere. Cranial nerves: Did not let me shine a light in her eyes initially but later was able to check her pupils which were about 5 mm dilated as she had an ophthalmologically examination early this morning, they were reactive and round bilaterally.  She was unable to count fingers and did not blink to threat from either side.  Her face appeared symmetric. Motor exam: She appeared to be moving all her extremities vigorously. Sensory exam: Did not perform Coordination: Did not cooperate for performing   Labs I have reviewed labs in epic and the results pertinent to this consultation are:  CBC    Component Value Date/Time   WBC 6.9 06/16/2017 1633   RBC 5.37 (H) 06/16/2017 1633   HGB 15.3 (H) 06/16/2017 1706   HCT 45.0 06/16/2017 1706   PLT 196 06/16/2017 1633   MCV 86.6 06/16/2017 1633   MCV 85.6 06/06/2011 1459   MCH 27.0 06/16/2017 1633   MCHC 31.2 06/16/2017 1633   RDW 14.3 06/16/2017 1633   LYMPHSABS 2.3 06/16/2017 1633   MONOABS 0.6 06/16/2017 1633   EOSABS 0.1 06/16/2017 1633   BASOSABS 0.0 06/16/2017 1633    CMP     Component Value Date/Time   NA 141 06/16/2017 1706   K 4.4 06/16/2017 1706   CL 104 06/16/2017 1706   CO2 27 06/16/2017 1633   GLUCOSE 97 06/16/2017 1706   BUN 16 06/16/2017 1706   CREATININE 1.00 06/16/2017 1706   CREATININE 0.76 06/06/2011 1445   CALCIUM 10.5 (H)  06/16/2017 1633   CALCIUM 10.4 06/06/2011 1446   PROT 7.1 06/16/2017 1633   ALBUMIN 3.9 06/16/2017 1633   AST 17 06/16/2017 1633   ALT 13 (L) 06/16/2017 1633   ALKPHOS 148 (H) 06/16/2017 1633   BILITOT 0.9 06/16/2017 1633   GFRNONAA 44 (L) 06/16/2017 1633   GFRAA 51 (L) 06/16/2017 1633  Imaging I have reviewed the images obtained: CT scan of the head showed no acute changes, old cerebellar infarct  Assessment:  84 year old woman with past medical history of anxiety, hypertension hyperlipidemia peripheral vascular disease rheumatoid arthritis dementia complaining of bilateral visual loss that started with a headache on Friday. Differentials to consider include stroke versus giant cell arteritis. She was unable to tolerate the MRI as she is extremely claustrophobic.  Might require MRI under sedation  in the morning.   Impression: Evaluate for giant cell arteritis Evaluate for stroke-Has to be a bilateral occipital stroke to have bilateral blindness. If all negative, consider behavioral etiology as well.  Recommendations: Obtain ESR, CRP MRI of the brain CT angiogram head and neck Hemoglobin A1c Fasting lipid panel 2D echocardiogram Telemetry Frequent neurochecks Physical therapy Outpatient therapy speech therapy Consider temporal artery biopsy, if the MRI is negative for stroke and ESR CRP are elevated Also presumptive treatment with high-dose prednisone if ESR and CRP are elevated until the biopsy results come back. We will follow with you.  Patient needs a more thorough exam when she is more cooperative and less agitated.  Neurology will continue to follow.  -- Amie Portland, MD [facility] Pager: [phone] If 7pm to 7am, please call on call as listed on AMION.

## 2017-06-16 NOTE — ED Notes (Signed)
ED Provider at bedside. 

## 2017-06-16 NOTE — ED Provider Notes (Signed)
[facility] EMERGENCY DEPARTMENT Provider Note   CSN: [id number] Arrival date & time: 06/16/17  1520     History   Chief Complaint Chief Complaint  Patient presents with  . Loss of Vision    HPI Stacie Bennett is a 84 y.o. female.  Pt reports bilateral vision loss that started on Friday evening. She has been having intermittent HA but none currently. Went to UC on Saturday and ophthalmology today. Sent here for stroke workup. Per pt and family, ophthalmology exam was normal except for visual acuity testing.  The history is provided by the patient.  Eye Problem   This is a new problem. The current episode started more than 2 days ago. The problem occurs constantly. The problem has not changed since onset.There is a problem in both eyes. There was no injury mechanism. The patient is experiencing no pain. There is no history of trauma to the eye. There is no known exposure to pink eye. She does not wear contacts. Associated symptoms include decreased vision. Pertinent negatives include no nausea and no vomiting. She has tried nothing for the symptoms.    Past Medical History:  Diagnosis Date  . Anxiety   . Bronchitis   . COPD (chronic obstructive pulmonary disease) ([place])   . Glaucoma   . Hyperlipidemia   . Hypertension   . Peripheral vascular disease ([place])   . Rheumatoid arthritis ([place])   . Vertigo     Patient Active Problem List   Diagnosis Date Noted  . COPD (chronic obstructive pulmonary disease) ([place]) 06/16/2017  . Hypertension 06/16/2017  . Vision loss, bilateral 06/16/2017  . Acute loss of vision, bilateral 06/16/2017    Past Surgical History:  Procedure Laterality Date  . ABDOMINAL HYSTERECTOMY    . BREAST SURGERY       OB History   None      Home Medications    Prior to Admission medications   Medication Sig Start Date End Date Taking? Authorizing Provider  acetaminophen (TYLENOL) 500 MG tablet Take 1,000 mg by mouth every 6 (six)  hours as needed for headache.   Yes [provider]  albuterol (PROVENTIL HFA;VENTOLIN HFA) 108 (90 BASE) MCG/ACT inhaler Inhale 2 puffs into the lungs every 6 (six) hours as needed for wheezing or shortness of breath. 09/25/14  Yes Horton, Barbette Hair, MD  amLODipine (NORVASC) 5 MG tablet Take 5 mg by mouth daily. 05/09/17  Yes [provider]  latanoprost (XALATAN) 0.005 % ophthalmic solution Place 1 drop into both eyes at bedtime.    Yes [provider]  meclizine (ANTIVERT) 25 MG tablet Take 25 mg by mouth every 8 (eight) hours as needed for dizziness.   Yes [provider]  mometasone-formoterol (DULERA) 100-5 MCG/ACT AERO Inhale 2 puffs into the lungs daily.   Yes [provider]    Family History Family History  Problem Relation Age of Onset  . Hypertension Other     Social History Social History   Tobacco Use  . Smoking status: Never Smoker  . Smokeless tobacco: Never Used  Substance Use Topics  . Alcohol use: No  . Drug use: No     Allergies   Keflex [cephalexin] and Pletal [cilostazol]   Review of Systems Review of Systems  Constitutional: Negative for chills and fever.  HENT: Negative for ear pain and sore throat.   Eyes: Positive for visual disturbance. Negative for pain.  Respiratory: Negative for cough and shortness of breath.  Cardiovascular: Negative for chest pain and palpitations.  Gastrointestinal: Negative for abdominal pain, nausea and vomiting.  Genitourinary: Negative for dysuria and hematuria.  Musculoskeletal: Negative for arthralgias and back pain.  Skin: Negative for color change and rash.  Neurological: Positive for headaches. Negative for seizures and syncope.  All other systems reviewed and are negative.    Physical Exam Updated Vital Signs BP (!) 190/62   Pulse 65   Temp 98.6 F (37 C) (Oral)   Resp 19   SpO2 99%   Physical Exam  Constitutional: She is oriented to person, place, and time.  She appears well-developed and well-nourished. No distress.  HENT:  Head: Normocephalic and atraumatic.  Eyes: Pupils are equal, round, and reactive to light. Conjunctivae and EOM are normal. Right eye exhibits no discharge. Left eye exhibits no discharge.  Patient appears to have central vision loss bilaterally.  She is able to see hand movement in her periphery of both eyes.  Neck: Neck supple.  Cardiovascular: Normal rate and regular rhythm.  No murmur heard. Pulmonary/Chest: Effort normal and breath sounds normal. No respiratory distress.  Abdominal: Soft. There is no tenderness.  Musculoskeletal: She exhibits no edema.  Neurological: She is alert and oriented to person, place, and time.  Strength is 5 out of 5 in all extremities.  Sensation to light touch intact throughout.  She does have bilateral central vision loss with only hand motion visual acuity in her periphery of bilateral eyes.  Otherwise, no cranial nerve deficit.  Heel-to-shin testing is normal.  Skin: Skin is warm and dry.  Psychiatric: She has a normal mood and affect.  Nursing note and vitals reviewed.    ED Treatments / Results  Labs (all labs ordered are listed, but only abnormal results are displayed) Labs Reviewed  APTT - Abnormal; Notable for the following components:      Result Value   aPTT 37 (*)    All other components within normal limits  CBC - Abnormal; Notable for the following components:   RBC 5.37 (*)    HCT 46.5 (*)    All other components within normal limits  COMPREHENSIVE METABOLIC PANEL - Abnormal; Notable for the following components:   Creatinine, Ser 1.09 (*)    Calcium 10.5 (*)    ALT 13 (*)    Alkaline Phosphatase 148 (*)    GFR calc non Af Amer 44 (*)    GFR calc Af Amer 51 (*)    All other components within normal limits  I-STAT CHEM 8, ED - Abnormal; Notable for the following components:   Hemoglobin 15.3 (*)    All other components within normal limits  PROTIME-INR    DIFFERENTIAL  HEMOGLOBIN A1C  LIPID PANEL  SEDIMENTATION RATE  C-REACTIVE PROTEIN  BASIC METABOLIC PANEL  I-STAT TROPONIN, ED  CBG MONITORING, ED    EKG EKG Interpretation  Date/Time:  Monday Jun 16 2017 16:33:21 EDT Ventricular Rate:  69 PR Interval:  140 QRS Duration: 70 QT Interval:  414 QTC Calculation: 443 R Axis:   72 Text Interpretation:  Normal sinus rhythm Septal infarct , age undetermined Abnormal ECG No significant change since last tracing Confirmed by Merrily Pew [id number]) on 06/16/2017 9:44:58 PM   Radiology Ct Head Wo Contrast  Result Date: 06/16/2017 CLINICAL DATA:  84 year old with focal neuro deficit. Sudden visual loss in both eyes. EXAM: CT HEAD WITHOUT CONTRAST TECHNIQUE: Contiguous axial images were obtained from the base of the skull through the vertex without intravenous contrast.  COMPARISON:  None. FINDINGS: Brain: Negative for acute hemorrhage, mass lesion, midline shift, hydrocephalus or new large infarct. Atrophy and encephalomalacia in the right temporal lobe. Focal encephalomalacia involving the right parietal and right occipital lobe. Mild ex vacuo dilatation of the right temporal horn. Vascular: No hyperdense vessel or unexpected calcification. Skull: Normal. Negative for fracture or focal lesion. Sinuses/Orbits: Negative Other: Evidence for a scalp lipoma along the left lateral forehead. IMPRESSION: No acute intracranial abnormality. 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 Electronically Signed   By: Markus Daft M.D.   On: 06/16/2017 17:58    Procedures Procedures (including critical care time)  Medications Ordered in ED Medications  albuterol (PROVENTIL) (2.5 MG/3ML) 0.083% nebulizer solution 3 mL (has no administration in time range)  latanoprost (XALATAN) 0.005 % ophthalmic solution 1 drop (has no administration in time range)  meclizine (ANTIVERT) tablet 25 mg (has no administration in time range)  mometasone-formoterol (DULERA) 100-5 MCG/ACT inhaler 2  puff (has no administration in time range)   stroke: mapping our early stages of recovery book (has no administration in time range)  0.9 %  sodium chloride infusion (has no administration in time range)  acetaminophen (TYLENOL) tablet 650 mg (has no administration in time range)    Or  acetaminophen (TYLENOL) solution 650 mg (has no administration in time range)    Or  acetaminophen (TYLENOL) suppository 650 mg (has no administration in time range)  senna-docusate (Senokot-S) tablet 1 tablet (has no administration in time range)  enoxaparin (LOVENOX) injection 40 mg (has no administration in time range)  aspirin suppository 300 mg (has no administration in time range)    Or  aspirin tablet 325 mg (has no administration in time range)  labetalol (NORMODYNE,TRANDATE) injection 5 mg (has no administration in time range)  LORazepam (ATIVAN) injection 1 mg (1 mg Intravenous Given 06/16/17 2053)     Initial Impression / Assessment and Plan / ED Course  I have reviewed the triage vital signs and the nursing notes.  Pertinent labs & imaging results that were available during my care of the patient were reviewed by me and considered in my medical decision making (see chart for details).    Patient is an 84 year old female with history as above who presents due to vision loss bilaterally since Friday.  She was having some headaches on Friday and Saturday, these have resolved.  She saw her ophthalmologist today.  She had per report a normal dilated retinal exam and normal eye pressures.  She currently denies any pain.  She was sent here due to concern for possible stroke.  On exam, she does have bilateral central vision loss.  She also has significantly decreased vision in her peripheral, now only visual acuity for hand motion.  The remainder of her neurologic exam is nonfocal.  Prior to this on Friday, she was typically able to see well enough to use a computer.  CT of her head shows no acute  abnormality.  MRI has been ordered.  Hospitalist consulted for admission.  MRI pending.  Final Clinical Impressions(s) / ED Diagnoses   Final diagnoses:  Visual loss    ED Discharge Orders    None       Clifton James, MD 06/16/17 2248    Merrily Pew, MD 06/16/17 2342

## 2017-06-16 NOTE — Progress Notes (Signed)
Patient appears to be over whelmed from the days activity, she refusing all care and efforts to attach doplper and or tellemetry machines for her Q2 neuro and vital signs, spoke with Dr Rory Percy and he agrees that we should not force her and wait until she is calmer. Will continue to monitor top the best of my ability throughout my shift.

## 2017-06-16 NOTE — Progress Notes (Addendum)
Patient arrived from ED/MRI around 2215, she was given 1 mg of ativan to calm her for MRI, which was unsuccessfull. She is still very scared and given her poor vision she is refusing care and at this time I am not able to attach monitor leads or assess her she does not want to be touched. Will continue to monitor.  Family now says patient has dementia Dr. Rory Percy is in room attempting to asses patient.

## 2017-06-16 NOTE — ED Provider Notes (Signed)
I saw and evaluated the patient, reviewed the resident's note and I agree with the findings and plan with the following exceptions.   Acute onset of binocular vision loss approximately 4 days ago saw Dr. Domingo Madeira ophthalmologist who recommended MRI to evaluate for stroke.  Patient still with severe bilateral vision loss.  On my exam her neurologic exam is otherwise unremarkable I deferred ambulation however fundus exam is normal bilateral eyes.  Plan will be to get MRI, admission for stroke work-up.   EKG Interpretation  Date/Time:  Monday Jun 16 2017 16:33:21 EDT Ventricular Rate:  69 PR Interval:  140 QRS Duration: 70 QT Interval:  414 QTC Calculation: 443 R Axis:   72 Text Interpretation:  Normal sinus rhythm Septal infarct , age undetermined Abnormal ECG No significant change since last tracing Confirmed by Merrily Pew [phone]) on 06/16/2017 9:44:58 PM         Alon Mazor, Corene Cornea, MD 06/16/17 2343

## 2017-06-17 ENCOUNTER — Encounter (HOSPITAL_COMMUNITY): Payer: Self-pay | Admitting: Radiology

## 2017-06-17 ENCOUNTER — Observation Stay (HOSPITAL_COMMUNITY): Payer: Medicare Other

## 2017-06-17 ENCOUNTER — Observation Stay (HOSPITAL_COMMUNITY): Payer: Medicare Other | Admitting: Anesthesiology

## 2017-06-17 ENCOUNTER — Encounter (HOSPITAL_COMMUNITY)
Admission: EM | Disposition: A | Discharge status: Home Health | Payer: Self-pay | Source: Home / Self Care | Attending: Internal Medicine

## 2017-06-17 ENCOUNTER — Other Ambulatory Visit (HOSPITAL_COMMUNITY): Payer: Medicare Other

## 2017-06-17 DIAGNOSIS — I63113 Cerebral infarction due to embolism of bilateral vertebral arteries: Secondary | ICD-10-CM | POA: Diagnosis not present

## 2017-06-17 DIAGNOSIS — J449 Chronic obstructive pulmonary disease, unspecified: Secondary | ICD-10-CM | POA: Diagnosis not present

## 2017-06-17 DIAGNOSIS — H543 Unqualified visual loss, both eyes: Secondary | ICD-10-CM | POA: Diagnosis not present

## 2017-06-17 DIAGNOSIS — H53133 Sudden visual loss, bilateral: Secondary | ICD-10-CM | POA: Diagnosis not present

## 2017-06-17 DIAGNOSIS — I10 Essential (primary) hypertension: Secondary | ICD-10-CM | POA: Diagnosis not present

## 2017-06-17 DIAGNOSIS — E782 Mixed hyperlipidemia: Secondary | ICD-10-CM | POA: Diagnosis not present

## 2017-06-17 DIAGNOSIS — I63412 Cerebral infarction due to embolism of left middle cerebral artery: Secondary | ICD-10-CM | POA: Diagnosis not present

## 2017-06-17 DIAGNOSIS — I6522 Occlusion and stenosis of left carotid artery: Secondary | ICD-10-CM | POA: Diagnosis not present

## 2017-06-17 DIAGNOSIS — R7989 Other specified abnormal findings of blood chemistry: Secondary | ICD-10-CM | POA: Diagnosis not present

## 2017-06-17 DIAGNOSIS — E079 Disorder of thyroid, unspecified: Secondary | ICD-10-CM | POA: Diagnosis not present

## 2017-06-17 HISTORY — PX: RADIOLOGY WITH ANESTHESIA: SHX6223

## 2017-06-17 LAB — LIPID PANEL
CHOLESTEROL: 228 mg/dL — AB (ref 0–200)
HDL: 56 mg/dL (ref >40)
LDL Cholesterol: 162 mg/dL — ABNORMAL HIGH (ref 0–99)
Total CHOL/HDL Ratio: 4.1 RATIO
Triglycerides: 49 mg/dL (ref <150)
VLDL: 10 mg/dL (ref 0–40)

## 2017-06-17 LAB — BASIC METABOLIC PANEL
Anion gap: 8 (ref 5–15)
BUN: 11 mg/dL (ref 6–20)
CHLORIDE: 107 mmol/L (ref 101–111)
CO2: 25 mmol/L (ref 22–32)
CREATININE: 0.87 mg/dL (ref 0.44–1.00)
Calcium: 9.8 mg/dL (ref 8.9–10.3)
GFR calc Af Amer: >60 mL/min (ref >60)
GFR calc non Af Amer: 58 mL/min — ABNORMAL LOW (ref >60)
Glucose, Bld: 93 mg/dL (ref 65–99)
Potassium: 4 mmol/L (ref 3.5–5.1)
SODIUM: 140 mmol/L (ref 135–145)

## 2017-06-17 LAB — HEMOGLOBIN A1C
HEMOGLOBIN A1C: 5.6 % (ref 4.8–5.6)
Mean Plasma Glucose: 114.02 mg/dL

## 2017-06-17 LAB — SEDIMENTATION RATE: SED RATE: 9 mm/h (ref 0–22)

## 2017-06-17 SURGERY — RADIOLOGY WITH ANESTHESIA
Anesthesia: General

## 2017-06-17 MED ORDER — STROKE: EARLY STAGES OF RECOVERY BOOK: Freq: Once | Status: DC

## 2017-06-17 MED ORDER — LIDOCAINE HCL (CARDIAC) PF 100 MG/5ML IV SOSY
PREFILLED_SYRINGE | INTRAVENOUS | Status: DC | PRN
  Administered 2017-06-17: 80 mg via INTRAVENOUS

## 2017-06-17 MED ORDER — FENTANYL CITRATE (PF) 100 MCG/2ML IJ SOLN
INTRAMUSCULAR | Status: DC | PRN
  Administered 2017-06-17: 25 ug via INTRAVENOUS

## 2017-06-17 MED ORDER — DEXAMETHASONE SODIUM PHOSPHATE 10 MG/ML IJ SOLN
INTRAMUSCULAR | Status: DC | PRN
  Administered 2017-06-17: 4 mg via INTRAVENOUS

## 2017-06-17 MED ORDER — ONDANSETRON HCL 4 MG/2ML IJ SOLN
INTRAMUSCULAR | Status: DC | PRN
  Administered 2017-06-17: 4 mg via INTRAVENOUS

## 2017-06-17 MED ORDER — LACTATED RINGERS IV SOLN
INTRAVENOUS | Status: DC
  Administered 2017-06-17: 12:00:00 via INTRAVENOUS

## 2017-06-17 MED ORDER — EPHEDRINE SULFATE 50 MG/ML IJ SOLN
INTRAMUSCULAR | Status: DC | PRN
  Administered 2017-06-17: 5 mg via INTRAVENOUS

## 2017-06-17 MED ORDER — CLOPIDOGREL BISULFATE 75 MG PO TABS
75.0000 mg | ORAL_TABLET | Freq: Every day | ORAL | Status: DC
  Administered 2017-06-18 – 2017-06-19 (×3): 75 mg via ORAL
  Filled 2017-06-17 (×3): qty 1

## 2017-06-17 MED ORDER — ATORVASTATIN CALCIUM 80 MG PO TABS
80.0000 mg | ORAL_TABLET | Freq: Every day | ORAL | Status: DC
  Administered 2017-06-17 – 2017-06-18 (×2): 80 mg via ORAL
  Filled 2017-06-17 (×2): qty 1

## 2017-06-17 MED ORDER — PROPOFOL 10 MG/ML IV BOLUS
INTRAVENOUS | Status: DC | PRN
  Administered 2017-06-17: 100 mg via INTRAVENOUS

## 2017-06-17 NOTE — Transfer of Care (Signed)
Immediate Anesthesia Transfer of Care Note  Patient: Stacie Bennett  Procedure(s) Performed: RADIOLOGY WITH ANESTHESIA (N/A )  Patient Location: PACU  Anesthesia Type:General  Level of Consciousness: awake and drowsy  Airway & Oxygen Therapy: Patient Spontanous Breathing and Patient connected to nasal cannula oxygen  Post-op Assessment: Report given to RN, Post -op Vital signs reviewed and stable and Patient moving all extremities X 4  Post vital signs: Reviewed and stable  Last Vitals:  Vitals Value Taken Time  BP 146/63 06/17/2017  2:05 PM  Temp 36.9 C 06/17/2017  2:05 PM  Pulse 75 06/17/2017  2:14 PM  Resp 15 06/17/2017  2:14 PM  SpO2 98 % 06/17/2017  2:14 PM  Vitals shown include unvalidated device data.  Last Pain:  Vitals:   06/17/17 1405  TempSrc:   PainSc: 0-No pain         Complications: No apparent anesthesia complications

## 2017-06-17 NOTE — Progress Notes (Signed)
Patients son came back this morning and has had a calm conversation with the patient who has obvious issues with dementia which was possible exacerbated by the ativan she was given. Having slept and no longer feeling the effects of the ativan she says that her vision is better I did assess her pupils and she is showing some reaction to impending movement to her eyes and fields of gaze are somewhat intact.  Eyes are light reactive and pupils are reacting a little better than sluggish but not brisk. She has been NPO since her arrival on this floor, in hopes of having a heavily MRI done under sedation.

## 2017-06-17 NOTE — Progress Notes (Signed)
PT Cancellation Note  Patient Details Name: Stacie Bennett MRN: [id number] DOB: 18-Jun-1927   Cancelled Treatment:    Reason Eval/Treat Not Completed: Patient at procedure or test/unavailable   Duncan Dull 06/17/2017, 12:12 PM

## 2017-06-17 NOTE — Progress Notes (Signed)
Progress Note    Stacie Bennett  XVQ:[id number] DOB: [birth year]  DOA: 06/16/2017 PCP: Darlyne Russian, MD    Brief Narrative:   Chief complaint: Bilateral vision loss.  Medical records reviewed and are as summarized below:  Stacie Bennett is an 84 y.o. female with a PMH of COPD, cataracts, glaucoma, rheumatoid arthritis, dementia and hypertension who was admitted 06/16/2017 for evaluation of bilateral vision loss associated with mild headache.  Evaluated by her ophthalmologist who directed her to the ED out of concern for stroke.  Initial CT negative for acute intracranial abnormality.  Assessment/Plan:   Principal Problem:   Vision loss, bilateral: Rule out stroke versus giant cell arteritis Initial CT personally reviewed and is negative for acute findings.  Unable to tolerate MRI due to claustrophobia.  Evaluated by neurologist with full stroke work-up and rheumatologic work-up for giant cell arteritis ordered.  ESR 9, CRP less than 0.8, not consistent with giant cell arteritis.  Hemoglobin A1c 5.6%.  Lipid panel pending.  Will need MRI under sedation.  Continue aspirin.  Follow-up CT angiogram head and neck, 2D echo, further neurologic recommendations.  Active Problems:   COPD (chronic obstructive pulmonary disease) ([place]) Continue Dulera and as needed albuterol.    Hypertension Continue labetalol as needed.  Norvasc on hold to allow permissive hypertension.     Obesity Body mass index is 31.35 kg/m.   Family Communication/Anticipated D/C date and plan/Code Status   DVT prophylaxis: Lovenox ordered. Code Status: Full Code.  Family Communication: Multiple family updated at bedside. Disposition Plan: Home in next 24 hours after work up completed.   Medical Consultants:    Neurology.   Anti-Infectives:    None  Subjective:   Reports some improvement in vision. Able to see fingers held up. No headache or other complaints.  Objective:    Vitals:   06/16/17  2000 06/16/17 2230 06/17/17 0630 06/17/17 0804  BP: (!) 190/62  (!) 173/72 (!) 148/52  Pulse: 65  70 68  Resp: '19  18 15  '$ Temp:   98.3 F (36.8 C)   TempSrc:   Oral Oral  SpO2: 99%  99% 100%  Weight:  70.4 kg (155 lb 3.3 oz)     No intake or output data in the 24 hours ending 06/17/17 1115 Filed Weights   06/16/17 2230  Weight: 70.4 kg (155 lb 3.3 oz)    Exam: General: No acute distress. Cardiovascular: Heart sounds show a regular rate, and rhythm. No gallops or rubs. No murmurs. No JVD. Lungs: Clear to auscultation bilaterally with good air movement. No rales, rhonchi or wheezes. Abdomen: Soft, nontender, nondistended with normal active bowel sounds. No masses. No hepatosplenomegaly. Neurological: Alert, PEERL (possibly slightly sluggish pupillary response on left compared to right), EOMI, able to see grossly. Skin: Warm and dry. No rashes or lesions. Extremities: No clubbing or cyanosis. No edema. Pedal pulses 2+. Psychiatric: Mood and affect are normal. Insight and judgment are fair.   Data Reviewed:   I have personally reviewed following labs and imaging studies:  Labs: Labs show the following:   Basic Metabolic Panel: Recent Labs  Lab 06/16/17 1633 06/16/17 1706 06/17/17 0623  NA 140 141 140  K 4.4 4.4 4.0  CL 105 104 107  CO2 27  --  25  GLUCOSE 95 97 93  BUN '13 16 11  '$ CREATININE 1.09* 1.00 0.87  CALCIUM 10.5*  --  9.8   GFR CrCl cannot be calculated (Unknown ideal weight.). Liver  Function Tests: Recent Labs  Lab 06/16/17 1633  AST 17  ALT 13*  ALKPHOS 148*  BILITOT 0.9  PROT 7.1  ALBUMIN 3.9   Coagulation profile Recent Labs  Lab 06/16/17 1633  INR 1.06    CBC: Recent Labs  Lab 06/16/17 1633 06/16/17 1706  WBC 6.9  --   NEUTROABS 3.9  --   HGB 14.5 15.3*  HCT 46.5* 45.0  MCV 86.6  --   PLT 196  --    CBG: Recent Labs  Lab 06/16/17 1902  GLUCAP 89   Hgb A1c: Recent Labs    06/17/17 0623  HGBA1C 5.6    Microbiology No  results found for this or any previous visit (from the past 240 hour(s)).  Procedures and diagnostic studies:  Ct Head Wo Contrast  Result Date: 06/16/2017 CLINICAL DATA:  84 year old with focal neuro deficit. Sudden visual loss in both eyes. EXAM: CT HEAD WITHOUT CONTRAST TECHNIQUE: Contiguous axial images were obtained from the base of the skull through the vertex without intravenous contrast. COMPARISON:  None. FINDINGS: Brain: Negative for acute hemorrhage, mass lesion, midline shift, hydrocephalus or new large infarct. Atrophy and encephalomalacia in the right temporal lobe. Focal encephalomalacia involving the right parietal and right occipital lobe. Mild ex vacuo dilatation of the right temporal horn. Vascular: No hyperdense vessel or unexpected calcification. Skull: Normal. Negative for fracture or focal lesion. Sinuses/Orbits: Negative Other: Evidence for a scalp lipoma along the left lateral forehead. IMPRESSION: No acute intracranial abnormality. 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 Electronically Signed   By: Markus Daft M.D.   On: 06/16/2017 17:58    Medications:   . [MAR Hold] aspirin  300 mg Rectal Daily   Or  . [MAR Hold] aspirin  325 mg Oral Daily  . [MAR Hold] enoxaparin (LOVENOX) injection  40 mg Subcutaneous Q24H  . [MAR Hold] latanoprost  1 drop Both Eyes QHS  . [MAR Hold] mometasone-formoterol  2 puff Inhalation Daily   Continuous Infusions:   LOS: 0 days   Jacquelynn Cree  [facility] Pager [phone]. If unable to reach me by pager, please call my cell phone at [phone].  *Please refer to [email or web address], password [email or web address] to get updated schedule on who will round on this patient, as hospitalists switch teams weekly. If 7PM-7AM, please contact night-coverage at [email or web address], password [email or web address] for any overnight needs.  06/17/2017, 11:15 AM

## 2017-06-17 NOTE — Anesthesia Postprocedure Evaluation (Signed)
Anesthesia Post Note  Patient: Stacie Bennett  Procedure(s) Performed: RADIOLOGY WITH ANESTHESIA (N/A )     Patient location during evaluation: PACU Anesthesia Type: General Level of consciousness: awake and alert Pain management: pain level controlled Vital Signs Assessment: post-procedure vital signs reviewed and stable Respiratory status: spontaneous breathing, nonlabored ventilation and respiratory function stable Cardiovascular status: blood pressure returned to baseline and stable Postop Assessment: no apparent nausea or vomiting Anesthetic complications: no    Last Vitals:  Vitals:   06/17/17 0804 06/17/17 1405  BP: (!) 148/52 (!) 146/63  Pulse: 68 83  Resp: 15 20  Temp:  36.9 C  SpO2: 100% 98%    Last Pain:  Vitals:   06/17/17 1405  TempSrc:   PainSc: 0-No pain                 Lorelle Macaluso,W. EDMOND

## 2017-06-17 NOTE — Progress Notes (Signed)
.   Follow command appropraitely ,still has some peripheral vision loss. Family at bed side ate only 10 % of  dinner.

## 2017-06-17 NOTE — Progress Notes (Signed)
PT Cancellation Note  Patient Details Name: Stacie Bennett MRN: [id number] DOB: Jun 23, 1927   Cancelled Treatment:    Reason Eval/Treat Not Completed: Patient at procedure or test/unavailable(in OR)   Duncan Dull 06/17/2017, 2:35 PM

## 2017-06-17 NOTE — Anesthesia Preprocedure Evaluation (Addendum)
Anesthesia Evaluation  Patient identified by MRN, date of birth, ID band Patient awake    Reviewed: Allergy & Precautions, H&P , NPO status , Patient's Chart, lab work & pertinent test results  Airway Mallampati: II  TM Distance: >3 FB Neck ROM: Full    Dental no notable dental hx. (+) Poor Dentition, Dental Advisory Given   Pulmonary COPD,  COPD inhaler,    Pulmonary exam normal breath sounds clear to auscultation       Cardiovascular hypertension, Pt. on medications + Peripheral Vascular Disease   Rhythm:Regular Rate:Normal     Neuro/Psych Anxiety Dementia    GI/Hepatic negative GI ROS, Neg liver ROS,   Endo/Other  negative endocrine ROS  Renal/GU negative Renal ROS  negative genitourinary   Musculoskeletal  (+) Arthritis , Osteoarthritis,    Abdominal   Peds  Hematology negative hematology ROS (+)   Anesthesia Other Findings   Reproductive/Obstetrics negative OB ROS                            Anesthesia Physical Anesthesia Plan  ASA: III  Anesthesia Plan: General   Post-op Pain Management:    Induction: Intravenous  PONV Risk Score and Plan: 3 and Ondansetron, Dexamethasone and Treatment may vary due to age or medical condition  Airway Management Planned: LMA  Additional Equipment:   Intra-op Plan:   Post-operative Plan: Extubation in OR  Informed Consent: I have reviewed the patients History and Physical, chart, labs and discussed the procedure including the risks, benefits and alternatives for the proposed anesthesia with the patient or authorized representative who has indicated his/her understanding and acceptance.   Dental advisory given  Plan Discussed with: CRNA  Anesthesia Plan Comments:         Anesthesia Quick Evaluation

## 2017-06-17 NOTE — Progress Notes (Signed)
Pt back from MRI awake and follow command. Cardiac monitor replaced and CCMT notified

## 2017-06-17 NOTE — Evaluation (Signed)
Speech Language Pathology Evaluation Patient Details Name: Stacie Bennett MRN: [id number] DOB: 04-06-1927 Today's Date: 06/17/2017 Time: 2542-7062 SLP Time Calculation (min) (ACUTE ONLY): 10 min  Problem List:  Patient Active Problem List   Diagnosis Date Noted  . COPD (chronic obstructive pulmonary disease) ([place]) 06/16/2017  . Hypertension 06/16/2017  . Vision loss, bilateral 06/16/2017  . Acute loss of vision, bilateral 06/16/2017   Past Medical History:  Past Medical History:  Diagnosis Date  . Anxiety   . Bronchitis   . COPD (chronic obstructive pulmonary disease) ([place])   . Glaucoma   . Hyperlipidemia   . Hypertension   . Peripheral vascular disease ([place])   . Rheumatoid arthritis ([place])   . Vertigo    Past Surgical History:  Past Surgical History:  Procedure Laterality Date  . ABDOMINAL HYSTERECTOMY    . BREAST SURGERY     HPI:  Stacie Bennett a 84 y.o.femalewith medical history significant forCOPD, cataracts, glaucoma, and hypertension, now presenting to the emergency department for evaluation of bilateral vision loss. Patient reports that she been in her usual state of health until 06/13/2017 when she developed a mild headache and vision loss involving the bilateral eyes. Symptoms resolved and she returned to her usual state until yesterday when the vision loss returned.She denies trauma, eye pain, eye discharge or redness, or similar symptoms previously. She no longer has a headache and denies any focal numbness or weakness. She saw her ophthalmologist today for evaluation, reports that her cataracts and glaucoma were said to be stable, and was directed to the ED with concern for stroke. Upon arrival to the ED, patient is found to be afebrile, saturating well on room air, and with vitals otherwise stable. EKG features a normal sinus rhythm and noncontrast head CT is negative for acute intracranial abnormality. Chemistry panel features a serum creatinine 1.09 and  CBC is unremarkable. Troponin is undetectable. MRI brain was ordered but not yet performed and neurology was consulted by the ED physician. Patient remains hemodynamically stable and will be observed on the telemetry unit for ongoing evaluation and management of bilateral vision loss with concern for possible GCA orstroke. MRI is pending.    Assessment / Plan / Recommendation Clinical Impression  Pt presents with general cognitive deficits that present as mild confusion. This is multifactoral and related to visual deficits, HOH and general decline in higher level cognition over last 6 months. Pt lives with son and daughter-in-law. Daughter-in-law present and states that pt is at cognitive baseline. They are able to provide care at current level. ST to sign off.     SLP Assessment  SLP Recommendation/Assessment: Patient does not need any further Speech Lanaguage Pathology Services    Follow Up Recommendations  None    Frequency and Duration           SLP Evaluation Cognition  Overall Cognitive Status: Impaired/Different from baseline Arousal/Alertness: Awake/alert Orientation Level: Oriented X4       Comprehension  Auditory Comprehension Overall Auditory Comprehension: Impaired at baseline(at baseline) Visual Recognition/Discrimination Discrimination: Not tested    Expression Expression Primary Mode of Expression: Verbal Verbal Expression Overall Verbal Expression: Appears within functional limits for tasks assessed   Oral / Motor  Oral Motor/Sensory Function Overall Oral Motor/Sensory Function: Within functional limits Motor Speech Overall Motor Speech: Appears within functional limits for tasks assessed Intelligibility: Intelligible   GO                    Stacie Bennett 06/17/2017,  12:57 PM

## 2017-06-17 NOTE — Progress Notes (Addendum)
Subjective: Pt is somnolent in bed post MRI with anesthesia.  She is able to minimally follow commands and identify a few colors, with significant improvement in vision.  Patient has clear speech without dysarthria but continues to have some encephalopathy, patient son states at baseline patient has dementia but she is functional.  Patient also has glaucoma and cataracts and does not want to pursue any type of surgery so does have some visual impairment family.  Only at bedside requested patient to rest and  to continue for testing tomorrow.  Exam: Vitals:   06/17/17 1405 06/17/17 1507  BP: (!) 146/63 (!) 157/67  Pulse: 83 75  Resp: 20 14  Temp: 98.4 F (36.9 C) 97.7 F (36.5 C)  SpO2: 98% 100%    Physical Exam  HEENT-  Normocephalic, no lesions, without obvious abnormality.  Normal external eye and conjunctiva.   Cardiovascular- S1-S2 audible, pulses palpable throughout   Lungs-no rhonchi or wheezing noted, no excessive working breathing.   Abdomen- All 4 quadrants palpated and nontender Musculoskeletal-trace edema in BLE Skin-warm and dry,   Neuro:  Mental Status: Patient is somnolent postanesthesia from MRI continues to have some encephalopathy/dementia.  She does wake up to follow commands minimally but with lack of concentration.  alert, oriented, thought content appropriate.  Speech fluent without evidence of aphasia or dysarthria.  She is aware of which hospital she is and, unaware of month or year.   Cranial Nerves: II:  Visual fields appears intact, but will require more accurate testing when patient more awake.  She is able to count fingers, accurately identify objects on the table and objects in the NIHSS book III,IV, VI: ptosis not present, patient with decreased concentration so unable to accurately assess extraocular motions patient does track, pupils equal, round, reactive to light V,VII: smile symmetric, facial light touch sensation normal bilaterally VIII: hearing  intact to voice XI: bilateral shoulder shrug XII: midline tongue extension Motor: She does move extremities equally but unable to accurately assess strength due to decreased concentration and minimally following commands.  She does turn in the bed to her side,   Tone and bulk:normal tone throughout; no atrophy noted Sensory: Sensation to painful stimuli intact bilaterally  Deep Tendon Reflexes: 2+ and symmetric throughout Plantars: Right: downgoing   Left: downgoing Cerebellar: normal finger-to-nose, unable to do frontal cerebellar testing t Gait: Not assessed    Medications:  . aspirin  300 mg Rectal Daily   Or  . aspirin  325 mg Oral Daily  . atorvastatin  80 mg Oral q1800  . enoxaparin (LOVENOX) injection  40 mg Subcutaneous Q24H  . latanoprost  1 drop Both Eyes QHS  . mometasone-formoterol  2 puff Inhalation Daily    Pertinent Labs/Diagnostics: Ct Head Wo Contrast 06/16/2017 IMPRESSION:  No acute intracranial abnormality. 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   MRI Brain/Mr Mra Head Wo Contrast 06/17/2017  IMPRESSION:  1. Acute/early subacute infarct involving the left posterior parietal and occipital lobes. No associated hemorrhage or mass effect. Few additional punctate foci are present within left frontal and temporal lobes. 2. Chronic infarct of right occipital lobe. 3. Moderate chronic microvascular ischemic changes and parenchymal volume loss of the brain. 4. Patent anterior and posterior intracranial circulation. 5. 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 Multiple segments of mild stenosis of the internal carotid arteries.    Assessment: 84 year old woman with past medical history of anxiety, hypertension hyperlipidemia peripheral vascular disease rheumatoid arthritis dementia complaining  of bilateral visual loss that started with a headache on Friday. Differentials to consider include stroke versus giant cell  arteritis. She was unable to tolerate the MRI as she is extremely claustrophobic.    Currently status post MRI with anesthesia which showed acute/early subacute infarct involving the left posterior parietal and occipital lobes and chronic occipital infarct.  1.  Acute/subacute left posterior and occipital lobe infarct in patient with a previous history of right occipital lobe infarct.  Patient presented with his symptoms of visit and changes and headaches which have since improved.  Currently pending full stroke work-up with echocardiogram and carotid Dopplers.  Stroke stratification labs showed very elevated cholesterol 228 and LDL 162, hemoglobin A1c 5.6.  For secondary stroke prevention we will continue aspirin 325 mg daily as well as high-dose atorvastatin.  We will continue early rehabilitation with physical therapy occupational therapy and speech therapy.  At this time patient's vision is significantly improving but due to patient's somnolence  during assessment, able to tell if she has any more toe deficits but she does move all of extremities equally.  ESR CRP were within normal limits, making giant cell arteritis low on the differential list 2.  COPD 3.  Hypertension 4.  Dementia  Recommendations: -Continue aspirin 325 mg -Continue high-dose statin-atorvastatin 80 mg daily -Continue frequent neuro checks -Continue telemetry -Pending echocardiogram and carotid Dopplers -Continue early rehabilitation with PT, OT and speech therapy -Maintain normotensive blood pressure   Letha Cape DNP Neuro-hospitalist Team [phone] 06/17/2017, 4:13 PM   NEUROHOSPITALIST ADDENDUM Seen and examined the patient today. I have reviewed the contents of history and physical exam as documented by PA/ARNP/Resident and agree with above documentation.  I have discussed and formulated the above plan as documented. Edits to the note have been made as needed.    Karena Addison Uziah Sorter MD [facility] [phone]   If 7pm to 7am, please call on call as listed on AMION.

## 2017-06-17 NOTE — Evaluation (Signed)
Occupational Therapy Evaluation Patient Details Name: Stacie Bennett MRN: [id number] DOB: [birth year] Today's Date: 06/17/2017    History of Present Illness 84 y.o. female with a PMH of COPD, cataracts, glaucoma, rheumatoid arthritis, dementia and hypertension who was admitted 06/16/2017 for evaluation of bilateral vision loss associated with mild headache.  Evaluated by her ophthalmologist who directed her to the ED out of concern for stroke.  Initial CT negative for acute intracranial abnormality.   Clinical Impression   Pt admitted with above and presents to OT with deficits listed below (see OT Problem List) impacting pt participation in ADLs.  Limited eval due to lethargy as pt having just returned from MRI under sedation.  Attempted visual assessment, however pt demonstrating difficulty following directions with visual assessment ?sedation vs apraxia.  Pt will benefit from further OT acutely to continue to assess vision during functional activity and prepare for d/c as listed below.    Follow Up Recommendations  Home health OT;Supervision/Assistance - 24 hour    Equipment Recommendations  Other (comment)(TBD)    Recommendations for Other Services       Precautions / Restrictions Precautions Precaution Comments: impaired vision             ADL either performed or assessed with clinical judgement     Vision Baseline Vision/History: Wears glasses Wears Glasses: At all times(bifocals) Vision Assessment?: Yes;Vision impaired- to be further tested in functional context Alignment/Gaze Preference: Gaze left Tracking/Visual Pursuits: Decreased smoothness of horizontal tracking;Impaired - to be further tested in functional context Visual Fields: Impaired-to be further tested in functional context Depth Perception: Undershoots Additional Comments: difficult to assess due to lethargy post MRI with sedation and difficulty following commands during visual testing             Pertinent Vitals/Pain Pain Assessment: No/denies pain        Extremity/Trunk Assessment Upper Extremity Assessment Upper Extremity Assessment: Generalized weakness;Difficult to assess due to impaired cognition(due to decreased arousal/sedation)              Cognition     Overall Cognitive Status: Difficult to assess  Due to lethargy post sedation                                              Home Living Family/patient expects to be discharged to:: Private residence Living Arrangements: Children Available Help at Discharge: Family Type of Home: House Home Access: Stairs to enter Technical brewer of Steps: 1                          Lives With: Son;Family    Prior Functioning/Environment Level of Independence: Independent        Comments: would use cane when outside        OT Problem List: Decreased strength;Decreased range of motion;Impaired vision/perception      OT Treatment/Interventions: Self-care/ADL training;Neuromuscular education;DME and/or AE instruction;Therapeutic activities;Visual/perceptual remediation/compensation;Patient/family education    OT Goals(Current goals can be found in the care plan section) Acute Rehab OT Goals Patient Stated Goal: to go home OT Goal Formulation: With patient/family Time For Goal Achievement: 07/01/17 Potential to Achieve Goals: Good  OT Frequency: Min 2X/week    AM-PAC PT "6 Clicks" Daily Activity     Outcome Measure Help from another person eating meals?: A Little Help from another person taking care of  personal grooming?: A Little Help from another person toileting, which includes using toliet, bedpan, or urinal?: A Little Help from another person bathing (including washing, rinsing, drying)?: A Little Help from another person to put on and taking off regular upper body clothing?: A Little Help from another person to put on and taking off regular lower body clothing?: A Lot 6  Click Score: 17   End of Session    Activity Tolerance: Patient limited by lethargy Patient left: in bed;with bed alarm set;with family/visitor present  OT Visit Diagnosis: Other symptoms and signs involving the nervous system (F68.127)                Time: 5170-0174 OT Time Calculation (min): 20 min Charges:  OT Evaluation $OT Eval Low Complexity: 1 Low  Simonne Come, [place] 06/17/2017, 3:59 PM

## 2017-06-17 NOTE — Progress Notes (Signed)
Vascular tech notes--came to the room, patient was with NP and other RN due to unknown condition. NP asked to come later.  Will try later.  Hongying Landry Mellow (RDMS RVT) 06/17/17 3:43 PM

## 2017-06-17 NOTE — Anesthesia Procedure Notes (Signed)
Procedure Name: LMA Insertion Date/Time: 06/17/2017 12:55 PM Performed by: Inda Coke, CRNA Pre-anesthesia Checklist: Patient identified, Emergency Drugs available, Suction available and Patient being monitored Patient Re-evaluated:Patient Re-evaluated prior to induction Oxygen Delivery Method: Circle System Utilized Preoxygenation: Pre-oxygenation with 100% oxygen Induction Type: IV induction Ventilation: Mask ventilation without difficulty LMA: LMA inserted LMA Size: 4.0 Number of attempts: 1 Airway Equipment and Method: Bite block Placement Confirmation: positive ETCO2 Tube secured with: Tape Dental Injury: Teeth and Oropharynx as per pre-operative assessment

## 2017-06-18 ENCOUNTER — Observation Stay (HOSPITAL_BASED_OUTPATIENT_CLINIC_OR_DEPARTMENT_OTHER): Payer: Medicare Other

## 2017-06-18 ENCOUNTER — Observation Stay (HOSPITAL_COMMUNITY): Payer: Medicare Other

## 2017-06-18 ENCOUNTER — Encounter (HOSPITAL_COMMUNITY): Payer: Self-pay | Admitting: Radiology

## 2017-06-18 DIAGNOSIS — I10 Essential (primary) hypertension: Secondary | ICD-10-CM | POA: Diagnosis not present

## 2017-06-18 DIAGNOSIS — I63113 Cerebral infarction due to embolism of bilateral vertebral arteries: Secondary | ICD-10-CM

## 2017-06-18 DIAGNOSIS — H543 Unqualified visual loss, both eyes: Secondary | ICD-10-CM | POA: Diagnosis not present

## 2017-06-18 DIAGNOSIS — I361 Nonrheumatic tricuspid (valve) insufficiency: Secondary | ICD-10-CM

## 2017-06-18 DIAGNOSIS — H547 Unspecified visual loss: Secondary | ICD-10-CM | POA: Diagnosis not present

## 2017-06-18 DIAGNOSIS — E079 Disorder of thyroid, unspecified: Secondary | ICD-10-CM | POA: Diagnosis not present

## 2017-06-18 DIAGNOSIS — J449 Chronic obstructive pulmonary disease, unspecified: Secondary | ICD-10-CM | POA: Diagnosis not present

## 2017-06-18 DIAGNOSIS — I63412 Cerebral infarction due to embolism of left middle cerebral artery: Secondary | ICD-10-CM | POA: Diagnosis not present

## 2017-06-18 DIAGNOSIS — I6522 Occlusion and stenosis of left carotid artery: Secondary | ICD-10-CM | POA: Diagnosis not present

## 2017-06-18 DIAGNOSIS — I6340 Cerebral infarction due to embolism of unspecified cerebral artery: Secondary | ICD-10-CM

## 2017-06-18 HISTORY — DX: Cerebral infarction due to embolism of unspecified cerebral artery: I63.40

## 2017-06-18 HISTORY — DX: Disorder of thyroid, unspecified: E07.9

## 2017-06-18 LAB — TSH: TSH: 0.227 u[IU]/mL — AB (ref 0.350–4.500)

## 2017-06-18 LAB — ECHOCARDIOGRAM COMPLETE: Weight: 2483.26 oz

## 2017-06-18 MED ORDER — ASPIRIN EC 325 MG PO TBEC
325.0000 mg | DELAYED_RELEASE_TABLET | Freq: Every day | ORAL | Status: DC
  Administered 2017-06-19: 325 mg via ORAL
  Filled 2017-06-18: qty 1

## 2017-06-18 MED ORDER — IOPAMIDOL (ISOVUE-370) INJECTION 76%
50.0000 mL | Freq: Once | INTRAVENOUS | Status: AC | PRN
  Administered 2017-06-18: 50 mL via INTRAVENOUS

## 2017-06-18 MED ORDER — ASPIRIN EC 81 MG PO TBEC: 81.0000 mg | DELAYED_RELEASE_TABLET | Freq: Every day | ORAL | Status: DC

## 2017-06-18 MED ORDER — IOPAMIDOL (ISOVUE-370) INJECTION 76%
INTRAVENOUS | Status: AC
  Administered 2017-06-18: 05:00:00
  Filled 2017-06-18: qty 50

## 2017-06-18 NOTE — Progress Notes (Signed)
PT Cancellation Note  Patient Details Name: Stacie Bennett MRN: [id number] DOB: 1927-07-28   Cancelled Treatment:    Reason Eval/Treat Not Completed: Patient at procedure or test/unavailable ([place]). Will re-attempt for PT evaluation.   Mabeline Caras, PT, DPT Acute Rehab Services  Pager: [place] 06/18/2017, 9:15 AM

## 2017-06-18 NOTE — Progress Notes (Signed)
Sent pt off floor for nonemergent ultrasound of neck.

## 2017-06-18 NOTE — Progress Notes (Addendum)
STROKE TEAM PROGRESS NOTE   INTERVAL HISTORY Patient is more awake, alert, oriented to self place and situation, follow commands as compared to yesterday.  She denies headaches, dizziness, nausea, vomiting, blurred vision.  Patient has baseline dementia.  Discussed the findings of the high-grade left ICA stenosis as well as bilateral V4 segment stenosis with patient's son and wife at bedside.  Patient's son does not want any further surgical or vascular intervention, but opts for medication management due to his mother being very elderly, very anxious and nervous and as well as her baseline dementia.   Vitals:   06/17/17 1507 06/18/17 0106 06/18/17 0753 06/18/17 0800  BP: (!) 157/67 (!) 152/61  (!) 155/60  Pulse: 75 63  64  Resp: '14 18  18  '$ Temp: 97.7 F (36.5 C) 97.8 F (36.6 C)  98 F (36.7 C)  TempSrc: Oral Oral  Oral  SpO2: 100% 98% 98% 99%  Weight:        CBC:  Recent Labs  Lab 06/16/17 1633 06/16/17 1706  WBC 6.9  --   NEUTROABS 3.9  --   HGB 14.5 15.3*  HCT 46.5* 45.0  MCV 86.6  --   PLT 196  --     Basic Metabolic Panel:  Recent Labs  Lab 06/16/17 1633 06/16/17 1706 06/17/17 0623  NA 140 141 140  K 4.4 4.4 4.0  CL 105 104 107  CO2 27  --  25  GLUCOSE 95 97 93  BUN '13 16 11  '$ CREATININE 1.09* 1.00 0.87  CALCIUM 10.5*  --  9.8   Lipid Panel:     Component Value Date/Time   CHOL 228 (H) 06/17/2017 0623   TRIG 49 06/17/2017 0623   HDL 56 06/17/2017 0623   CHOLHDL 4.1 06/17/2017 0623   VLDL 10 06/17/2017 0623   LDLCALC 162 (H) 06/17/2017 0623   HgbA1c:  Lab Results  Component Value Date   HGBA1C 5.6 06/17/2017   IMAGING Ct Angio Head W Or Wo Contrast  06/18/2017 IMPRESSION:  CT HEAD: 1. Evolving acute LEFT parieto-occipital lobe infarcts without hemorrhagic conversion.  2. Old RIGHT occipital lobe/PCA territory infarct. Old LEFT basal ganglia infarct. CTA NECK:  1. Critical stenosis LEFT ICA origin. Less than 50% stenosis RIGHT ICA.  2. Severe  stenosis bilateral vertebral artery origins.  3. **An incidental finding of potential clinical significance has been found. 3.6 cm RIGHT thyroid nodule. LEFT thyroid mass, lymphadenopathy or parathyroid adenoma. Recommend thyroid ultrasound on NONEMERGENT basis. This follows ACR consensus guidelines: Managing Incidental Thyroid Nodules Detected on Imaging: White Paper of the ACR Incidental Thyroid Findings Committee. J Am Coll Radiol 2015; 12:143-150  ** CTA HEAD: 1. Occluded bilateral V4 segments with severe stenosis and transient occlusion basilar artery. Severe stenoses bilateral PCA. 2. Attenuated RIGHT MCA mid to distal segments. Moderate stenosis LEFT M1 segment. 3. Moderate stenosis bilateral ICA. RIGHT cavernous ICA blister aneurysm.   Ct Head Wo Contrast 06/16/2017 IMPRESSION:  No acute intracranial abnormality. 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   Mr Brain Wo Contrast/MRA head  06/17/2017 IMPRESSION:  1. Acute/early subacute infarct involving the left posterior parietal and occipital lobes. No associated hemorrhage or mass effect. Few additional punctate foci are present within left frontal and temporal lobes.  2. Chronic infarct of right occipital lobe.  3. Moderate chronic microvascular ischemic changes and parenchymal volume loss of the brain.  4. Patent anterior and posterior intracranial circulation. 5. Multiple segments of severe stenosis of the basilar artery and bilateral posterior  cerebral arteries. Multiple segments of mild stenosis of the internal carotid arteries. These results will be called to the ordering clinician or representative by the Radiologist Assistant, and communication documented in the PACS or zVision Dashboard.   PHYSICAL EXAM HEENT-  Normocephalic, no lesions, without obvious abnormality.  Normal external eye and conjunctiva.   Cardiovascular- S1-S2 audible, pulses palpable throughout   Lungs-no rhonchi or wheezing noted, no excessive working breathing.   Abdomen-  All 4 quadrants palpated and nontender Musculoskeletal-trace edema in BLE Skin-warm and dry,   Neurological Examination Mental Status: Alert, oriented, thought content appropriate, answering questions but continues to have some mild forgetfulness.Marland Kitchen  Speech fluent without evidence of aphasia.  Able to follow 3 step commands without difficulty.  Today she is aware of situation, location month and year. Cranial Nerves: II: Decreased peripheral vision with baseline myopia, cataracts and glaucoma III,IV, VI: ptosis not present, extra-ocular motions intact bilaterally pupils equal, round, reactive to light  V,VII: smile symmetric, facial light touch sensation normal bilaterally VIII: Hearing intact to voice IX,X: uvula rises symmetrically XI: bilateral shoulder shrug XII: midline tongue extension Motor: Patient does move all extremities equally without any focal deficits. Right : Upper extremity   5/5    Left:     Upper extremity   5/5  Lower extremity   5/5     Lower extremity   5/5 Tone and bulk:normal tone throughout; no atrophy noted Sensory: Pinprick and light touch intact throughout, bilaterally Deep Tendon Reflexes: 2+ and symmetric throughout Plantars: Right: downgoing   Left: downgoing Cerebellar: Impaired  finger-to-nose right side patient may not be to visualize fingers in the periphery., normal rapid alternating movements and normal heel-to-shin test Gait: Not assessed  ASSESSMENT/PLAN Ms. Akera Snowberger is a 84 y.o. female 84 year old woman with past medical history of anxiety, hypertension hyperlipidemia peripheral vascular disease rheumatoid arthritis dementia complaining of bilateral visual loss that started with a headache on Friday. Differentials to consider include stroke versus giant cell arteritis. She was unable to tolerate the MRI as she is extremely claustrophobic.   Currently status post MRI with anesthesia which showed acute/early subacute infarct involving the left  posterior parietal and occipital lobes and chronic occipital infarct.CTA head and neck showed critical high-grade left ICA stenosis as well as less than 50% stenosis in the right ICA occluded bilateral V4 segment with severe stenosis and transient occlusion basilar artery.  Severe stenosis in the bilateral PCA.   Acute/subacute left posterior and occipital lobe infarct in patient with a previous history of right occipital lobe infarct likely secondary to critical high-grade left ICA stenosis as well as before segment with severe stenosis and transient occlusion of basilar artery.  Resultant initial vision impairment, headaches which are currently improving, no focal or sensory deficits.  CT head:  No acute intracranial abnormality. Old right cerebral infarct  CTA head & neck CTA head/Neck : showed critical high-grade left ICA stenosis as well as less than 50% stenosis in the right ICA occluded bilateral V4 segment with severe stenosis and transient occlusion basilar artery.  Severe stenosis in the bilateral PCA.  MRI : Acute/early subacute infarct involving the left posterior parietal and occipital lobes.  Chronic infarct of right occipital brain   MRA : Moderate chronic microvascular ischemic changes and parenchymal volume loss of the brain. 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 Multiple segments of mild stenosis of the internal carotid arteries.  2D Echo - No cardiac source of  emboli was identified EF 65- 70%  LDL 162  HgbA1c 5.6  Lovenox for VTE prophylaxis Diet Order           Diet Heart Room service appropriate? Yes; Fluid consistency: Thin  Diet effective now           No antiplatelets prior to admission, patient started on dual antiplatelet with aspirin and Plavix for 3 months and then resume Plavix alone, in the setting of severe vascular disease.  Continue telemetry monitoring and frequent neuro checks  Therapy  recommendations:  Pending recommendations  Disposition: pending recommendation   Critical high-grade left ICA stenosis and severe stenosis in bilateral V4 segments.  Per findings on CTA head and neck.   Discussed the severity of the stenosis, that vascular interventions of endarterectomies and carotid stent placement, medication management and the risks no intervention or treatment with patient's son Francie Massing and his wife at the bedside.  Patient's son declined further surgical or vascular interventions as his mother is elderly and has dementia with episodes of increased anxiety and nervousness and would rather pursue medication management as an option.  They are more concerned with mother's comfort and being able to go back to continue her previous home activities. They were informed that  patient will continue with a dual antiplatelet aspirin statin as well as high-dose cholesterol for further management.  Hypertension Accelerated  . Permissive hypertension (OK if < 220/120) but gradually normalize in 5-7 days . Long-term BP goal normotensive  Hyperlipidemia  Was not on home cholesterol med regimen  LDL 162, goal < 70  Added Atorvastatin '80mg'$   Continue statin at discharge  Right thyroid nodule 3.6cmm and left thyroid mass  An incidental finding on CTA Neck  Medicine team to continue to manage or patient to follow-up with PCP outpatient for evaluation   Other Stroke Risk Factors  Advanced age  Obesity, Body mass index is 31.35 kg/m., recommend weight loss, diet and exercise as appropriate   Hx stroke/TIA  Coronary artery disease/ PVD   Other Active Problems  Baseline cataracts and glaucoma patient underlying visual impairment.  Dementia  Hospital day # 0  Carney Bern DNP 11:51 AM 06/18/17  ATTENDING NOTE: I reviewed above note and agree with the assessment and plan. I have made any additions or clarifications directly to the above note. Pt was seen and  examined.   84 year old female with history of COPD, hypertension, HLD, PVD, RA, dementia, cataract and glaucoma admitted for headache and decreased vision.  CT head showed bilateral chronic occipital infarcts, and acute/subacute left large occipital infarct.  MRI confirmed acute large left inferior MCA/PCA infarct, but also left MCA punctate infarcts.  MRI showed multiple segment of severe stenosis of basilar artery and bilateral PCAs.  CTA head and neck showed critical stenosis left ICA proximally.  Right ICA proximal atherosclerosis, less than 50% stenosis.  Bilateral VA origin severe stenosis.  ESR/CRP negative.  EF 65-0%. A1c 5.6, LDL 162.  On exam, patient awake alert, fully orientated.  PERRL, EOMI.  However, severely decreased visual acuity, only can see hand waving, but not counting fingers.  Left hemianopia, right upper quadrantanopia, and decreased vision at right lower quadrant.  Otherwise, no focal neurological deficit.  Patient stroke mainly at left MCA inferior territory adjacent to left PCA territory.  Still consistent with left ICA high-grade stenosis.  Discussed with son and patient about further revascularization procedure of left ICA versus left carotid stenting.  However, patient and family refused further  surgical intervention.  They would like continued medical management.  We will recommend aspirin 325 and Plavix for 3 months and then Plavix alone.  Also continue Lipitor 80 for stroke prevention.  BP goal 130-150 given critical stenosis of left ICA. PT/OT recommend home PT OT.  Also recommended patient and family to  check with low vision service/program for further help.  Patient not driving.   Neurology will sign off. Please call with questions. Pt will follow up with stroke clinic NP at [facility] in about 4 weeks. Thanks for the consult.   Rosalin Hawking, MD PhD Stroke Neurology 06/18/2017 3:59 PM     To contact Stroke Continuity provider, please refer to [email or web address] After hours,  contact General Neurology

## 2017-06-18 NOTE — Evaluation (Addendum)
Physical Therapy Evaluation Patient Details Name: Stacie Bennett MRN: [id number] DOB: [birth year] Today's Date: 06/18/2017   History of Present Illness  Pt is an 84 y.o. female 84 y.o. woman with PMH of anxiety, HTN, PVD, RA dementia, admitted 06/16/17 with c/o of bilateral visual loss that started with a headache on Friday (5/17). 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     Clinical Impression  Pt presents with an overall decrease in functional mobility secondary to above. PTA, pt mod indep using SPC for community ambulation; pt with baseline dementia, living with very supportive son and daughter-in-law who are available for 24/7 support. Family notes worsening memory and cognitive impairments over past couple weeks, including difficulty getting in/out of tub, distinguishing hot/cold water temp, using phone, etc. Today, pt with significant visual impairment; amb 150' with RW and min guard for balance/direction. Son present throughout session hopeful for pt's return home. Pt would benefit from continued acute PT services to maximize functional mobility and independence prior to d/c with HHPT/OT services.     Follow Up Recommendations Home health PT;Supervision/Assistance - 24 hour    Equipment Recommendations  Rolling walker with 5" wheels    Recommendations for Other Services       Precautions / Restrictions Precautions Precautions: Fall;Other (comment) Precaution Comments: impaired vision Restrictions Weight Bearing Restrictions: No      Mobility  Bed Mobility Overal bed mobility: Independent             General bed mobility comments: Indep to long sit and move to EOB  Transfers Overall transfer level: Needs assistance Equipment used: None;Rolling walker (2 wheeled) Transfers: Sit to/from Stand Sit to Stand: Min guard         General transfer comment: Required HHA to maintain  balance when standing with no DME, but no physical assist to stand; stability improved with RW  Ambulation/Gait Ambulation/Gait assistance: Min guard Ambulation Distance (Feet): 150 Feet Assistive device: Rolling walker (2 wheeled) Gait Pattern/deviations: Step-through pattern;Decreased stride length;Drifts right/left Gait velocity: Decreased Gait velocity interpretation: <1.31 ft/sec, indicative of household ambulator General Gait Details: Initial instability, improving with BUE support on RW; min guard for balance and vision issues  Stairs            Wheelchair Mobility    Modified Rankin (Stroke Patients Only) Modified Rankin (Stroke Patients Only) Pre-Morbid Rankin Score: No symptoms Modified Rankin: Moderate disability     Balance Overall balance assessment: Needs assistance Sitting-balance support: No upper extremity supported Sitting balance-Leahy Scale: Good       Standing balance-Leahy Scale: Poor Standing balance comment: Reliant on HHA for stability                             Pertinent Vitals/Pain Pain Assessment: No/denies pain    Home Living Family/patient expects to be discharged to:: Private residence Living Arrangements: Children Available Help at Discharge: Family;Available 24 hours/day Type of Home: House Home Access: Stairs to enter Entrance Stairs-Rails: (Door frame) Entrance Stairs-Number of Steps: 1 Home Layout: One level Home Equipment: Tub bench;Cane - single point Additional Comments: Pt reports multiple falls at home - Son and daughter in law report no falls at home    Prior Function Level of Independence: Independent with assistive device(s)         Comments: Uses SPC for community ambulation; no DME for household. Uses meals on wheels program. trouble  getting in and out of the tub in the past couple weeks     Hand Dominance   Dominant Hand: Right    Extremity/Trunk Assessment   Upper Extremity  Assessment Upper Extremity Assessment: Defer to OT evaluation    Lower Extremity Assessment Lower Extremity Assessment: Generalized weakness    Cervical / Trunk Assessment Cervical / Trunk Assessment: Normal  Communication   Communication: HOH  Cognition Arousal/Alertness: Awake/alert Behavior During Therapy: WFL for tasks assessed/performed Overall Cognitive Status: History of cognitive impairments - at baseline Area of Impairment: Orientation;Attention;Memory;Following commands;Safety/judgement;Awareness;Problem solving                 Orientation Level: Disoriented to;Time Current Attention Level: Sustained Memory: Decreased short-term memory Following Commands: Follows multi-step commands with increased time Safety/Judgement: Decreased awareness of deficits Awareness: Emergent Problem Solving: Slow processing;Difficulty sequencing;Requires verbal cues        General Comments General comments (skin integrity, edema, etc.): Son and daughter-in-law present throughout session. Significant visual impairment; pt reports able to see shadows    Exercises     Assessment/Plan    PT Assessment Patient needs continued PT services  PT Problem List Decreased strength;Decreased activity tolerance;Decreased balance;Decreased mobility;Decreased cognition;Decreased knowledge of use of DME       PT Treatment Interventions DME instruction;Gait training;Stair training;Functional mobility training;Therapeutic activities;Therapeutic exercise;Balance training;Neuromuscular re-education;Cognitive remediation;Patient/family education    PT Goals (Current goals can be found in the Care Plan section)  Acute Rehab PT Goals Patient Stated Goal: Return home PT Goal Formulation: With patient/family Time For Goal Achievement: 07/02/17 Potential to Achieve Goals: Good    Frequency Min 4X/week   Barriers to discharge        Co-evaluation PT/OT/SLP Co-Evaluation/Treatment: Yes Reason  for Co-Treatment: Necessary to address cognition/behavior during functional activity;For patient/therapist safety PT goals addressed during session: Mobility/safety with mobility;Balance         AM-PAC PT "6 Clicks" Daily Activity  Outcome Measure Difficulty turning over in bed (including adjusting bedclothes, sheets and blankets)?: None Difficulty moving from lying on back to sitting on the side of the bed? : None Difficulty sitting down on and standing up from a chair with arms (e.g., wheelchair, bedside commode, etc,.)?: A Little Help needed moving to and from a bed to chair (including a wheelchair)?: A Little Help needed walking in hospital room?: A Little Help needed climbing 3-5 steps with a railing? : A Little 6 Click Score: 20    End of Session Equipment Utilized During Treatment: Gait belt Activity Tolerance: Patient tolerated treatment well Patient left: in chair;with call bell/phone within reach;with family/visitor present Nurse Communication: Mobility status PT Visit Diagnosis: Other abnormalities of gait and mobility (R26.89);Other symptoms and signs involving the nervous system (R29.898)    Time: 1430-1511 PT Time Calculation (min) (ACUTE ONLY): 41 min   Charges:   PT Evaluation $PT Eval Moderate Complexity: 1 Mod PT Treatments $Gait Training: 8-22 mins   PT G Codes:       Mabeline Caras, PT, DPT Acute Rehab Services  Pager: [phone] 06/18/2017, 3:36 PM

## 2017-06-18 NOTE — Progress Notes (Signed)
Occupational Therapy Treatment Patient Details Name: Stacie Bennett MRN: [id number] DOB: 07/12/1927 Today's Date: 06/18/2017    History of present illness Pt is an 84 y.o. woman with PMH of anxiety, HTN, PVD, RA dementia, admitted 06/16/17 with c/o of bilateral visual loss that started with a headache on Friday (5/17). 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    OT comments  Pt progressing towards OT goals this session. Further visual assessment performed (see vision section below). Pt is really only "seeing shadows" and education initiated for visual compensatory strategies (I.e increasing contrast...) Pt also reports that at home she has a really difficult time getting in and out of the tub (even with a tub bench). Pt will benefit from continued OT in the acute setting and HHOT OT will be essential for home eval to assist with visual compensatory strategies/set up for safety for fall prevention and also with tub transfer. Son and daughter-in-law are wonderfully supportive and available for 24 hour assist. Next session also please provide contact information for services with the blind.    Follow Up Recommendations  Home health OT;Supervision/Assistance - 24 hour    Equipment Recommendations  None recommended by OT(Pt has appropriate DME for OT)    Recommendations for Other Services      Precautions / Restrictions Precautions Precautions: Fall;Other (comment) Precaution Comments: impaired vision Restrictions Weight Bearing Restrictions: No       Mobility Bed Mobility Overal bed mobility: Independent             General bed mobility comments: Indep to long sit and move to EOB  Transfers Overall transfer level: Needs assistance Equipment used: None;Rolling walker (2 wheeled) Transfers: Sit to/from Stand Sit to Stand: Min guard         General transfer comment: Required HHA to  maintain balance when standing with no DME, but no physical assist to stand; stability improved with RW    Balance Overall balance assessment: Needs assistance Sitting-balance support: No upper extremity supported Sitting balance-Leahy Scale: Good Sitting balance - Comments: able to don shoes sitting EOB     Standing balance-Leahy Scale: Poor Standing balance comment: Reliant on HHA/RW for stability                           ADL either performed or assessed with clinical judgement   ADL Overall ADL's : Needs assistance/impaired Eating/Feeding: Set up;Sitting   Grooming: Minimal assistance;Standing Grooming Details (indicate cue type and reason): Unable to locate items in crowded bucket, but able to locate with increased contrast (black comb on white counter) - Pt with decreased activity tolerance     Lower Body Bathing: Min guard;Sitting/lateral leans Lower Body Bathing Details (indicate cue type and reason): to don house slippers with zipper closure         Toilet Transfer: Min guard;Minimal assistance;Ambulation;RW Toilet Transfer Details (indicate cue type and reason): min guard assist for room navigation         Functional mobility during ADLs: Min guard;Minimal assistance;Rolling walker       Vision   Vision Assessment?: Yes;Vision impaired- to be further tested in functional context Eye Alignment: Within Functional Limits Ocular Range of Motion: Within Functional Limits;Other (comment)(took a lot of cues/directions, but eyes able to move [facility]) Alignment/Gaze Preference: Gaze left Tracking/Visual Pursuits: Decreased smoothness of horizontal tracking;Requires cues, head turns, or add eye shifts to track Visual Fields: Impaired-to  be further tested in functional context Additional Comments: Pt reports that vision is much darker than before "I can only see shadows". Initially had a REALLY hard time tracking or getting to eyes cross midline to the right, but  eventually did. Education initiated about visual compensatory strategies (contrast etc)   Perception     Praxis      Cognition Arousal/Alertness: Awake/alert Behavior During Therapy: WFL for tasks assessed/performed Overall Cognitive Status: History of cognitive impairments - at baseline Area of Impairment: Orientation;Attention;Memory;Following commands;Safety/judgement;Awareness;Problem solving                 Orientation Level: Disoriented to;Time Current Attention Level: Sustained Memory: Decreased short-term memory Following Commands: Follows multi-step commands with increased time Safety/Judgement: Decreased awareness of deficits Awareness: Emergent Problem Solving: Slow processing;Difficulty sequencing;Requires verbal cues          Exercises     Shoulder Instructions       General Comments Son and daughter-in-law present throughout session    Pertinent Vitals/ Pain       Pain Assessment: No/denies pain  Home Living Family/patient expects to be discharged to:: Private residence Living Arrangements: Children Available Help at Discharge: Family;Available 24 hours/day Type of Home: House Home Access: Stairs to enter [employer] of Steps: 1 Entrance Stairs-Rails: (Door frame) Home Layout: One level     Bathroom Shower/Tub: Teacher, early years/pre: Handicapped height     Home Equipment: Tub bench;Cane - single point   Additional Comments: Pt reports multiple falls at home - Son and daughter in law report no falls at home  Lives With: Son;Family    Prior Functioning/Environment Level of Independence: Independent with assistive device(s)        Comments: Uses [facility] for community ambulation; no DME for household. Uses meals on wheels program. trouble getting in and out of the tub in the past couple weeks   Frequency  Min 2X/week        Progress Toward Goals  OT Goals(current goals can now be found in the care plan  section)  Progress towards OT goals: Progressing toward goals  Acute Rehab OT Goals Patient Stated Goal: Return home OT Goal Formulation: With patient/family Time For Goal Achievement: 07/01/17 Potential to Achieve Goals: Good  Plan Discharge plan remains appropriate;Frequency remains appropriate    Co-evaluation    PT/OT/SLP Co-Evaluation/Treatment: Yes Reason for Co-Treatment: Necessary to address cognition/behavior during functional activity;For patient/therapist safety PT goals addressed during session: Mobility/safety with mobility;Balance OT goals addressed during session: ADL's and self-care;Other (comment)(vision)      AM-PAC PT "6 Clicks" Daily Activity     Outcome Measure   Help from another person eating meals?: A Little Help from another person taking care of personal grooming?: A Little Help from another person toileting, which includes using toliet, bedpan, or urinal?: A Little Help from another person bathing (including washing, rinsing, drying)?: A Little Help from another person to put on and taking off regular upper body clothing?: A Little Help from another person to put on and taking off regular lower body clothing?: A Little 6 Click Score: 18    End of Session Equipment Utilized During Treatment: Gait belt;Rolling walker  OT Visit Diagnosis: Low vision, both eyes (H54.2);Unsteadiness on feet (R26.81);Other abnormalities of gait and mobility (R26.89)   Activity Tolerance Patient tolerated treatment well   Patient Left in chair;with call bell/phone within reach;with family/visitor present   Nurse Communication Mobility status        Time: 4196-2229  OT Time Calculation (min): 41 min  Charges: OT General Charges $OT Visit: 1 Visit OT Treatments $Self Care/Home Management : 8-22 mins  Hulda Humphrey OTR/L [place] 06/18/2017, 4:59 PM

## 2017-06-18 NOTE — Progress Notes (Signed)
Progress Note    Stacie Bennett  GYB:[id number] DOB: 1927-09-22  DOA: 06/16/2017 PCP: Darlyne Russian, MD    Brief Narrative:   Chief complaint: Bilateral vision loss.  Medical records reviewed and are as summarized below:  Stacie Bennett is an 84 y.o. female with a PMH of COPD, cataracts, glaucoma, rheumatoid arthritis, dementia and hypertension who was admitted 06/16/2017 for evaluation of bilateral vision loss associated with mild headache.  Evaluated by her ophthalmologist who directed her to the ED out of concern for stroke.  Initial CT negative for acute intracranial abnormality.  Assessment/Plan:   Principal Problem:   Vision loss, bilateral secondary to acute/subacute left posterior and occipital lobe infarcts in a patient with prior history of stroke Initial CT negative for acute findings.  MRI under anesthesia 06/17/2017 personally reviewed and confirmed acute/subacute stroke in the left posterior and occipital lobes as pictured below.  Neurology following.  CTA of head and neck confirms significant CVD. Family not sure she wants to pursue any type of invasive intervention. Hemoglobin A1c 5.6%.  Lipid panel showed an LDL of 162.  Continue aspirin, high-dose statin added.  Will need ongoing PT/OT.  Active Problems:   Thyroid mass Incidentally discovered on CTA head and neck. 2.5 x 2.5 cm with mass effect on theesophagus. Will get thyroid ultrasound, check TSH.    COPD (chronic obstructive pulmonary disease) (HCC) Stable.  Continue Dulera and as needed albuterol.    Hypertension Continue labetalol as needed.  Norvasc on hold to allow permissive hypertension.  Systolic blood pressure in the 150s.     Obesity Body mass index is 31.35 kg/m.   Family Communication/Anticipated D/C date and plan/Code Status   DVT prophylaxis: Lovenox ordered. Code Status: Full Code.  Family Communication: Daughter updated at bedside. Disposition Plan: Home in next 24 hours after work up  completed. New thyroid mass needs further work up.   Medical Consultants:    Neurology.   Anti-Infectives:    None  Subjective:   No further improvement in vision. No new complaints. No headache, nausea or vomiting.  Objective:    Vitals:   06/18/17 0106 06/18/17 0753 06/18/17 0800 06/18/17 1151  BP: (!) 152/61  (!) 155/60 131/73  Pulse: 63  64 67  Resp: 18  18 18   Temp: 97.8 F (36.6 C)  98 F (36.7 C) 98.1 F (36.7 C)  TempSrc: Oral  Oral Oral  SpO2: 98% 98% 99% 99%  Weight:        Intake/Output Summary (Last 24 hours) at 06/18/2017 1408 Last data filed at 06/17/2017 1510 Gross per 24 hour  Intake 420 ml  Output -  Net 420 ml   Filed Weights   06/16/17 2230  Weight: 70.4 kg (155 lb 3.3 oz)    Exam: General: No acute distress. Cardiovascular: Heart sounds show a regular rate, and rhythm. No gallops or rubs. No murmurs. No JVD. Lungs: Clear to auscultation bilaterally with good air movement. No rales, rhonchi or wheezes. Abdomen: Soft, nontender, nondistended with normal active bowel sounds. No masses. No hepatosplenomegaly. Neurological: Alert and oriented 3. Moves all extremities 4 with equal strength. Visual impairment unchanged. Skin: Warm and dry. No rashes or lesions. Extremities: No clubbing or cyanosis. No edema. Pedal pulses 2+. Psychiatric: Mood and affect are depressed. Insight and judgment are normal.    Data Reviewed:   I have personally reviewed following labs and imaging studies:  Labs: Labs show the following:   Basic Metabolic Panel: Recent  Labs  Lab 06/16/17 1633 06/16/17 1706 06/17/17 0623  NA 140 141 140  K 4.4 4.4 4.0  CL 105 104 107  CO2 27  --  25  GLUCOSE 95 97 93  BUN 13 16 11   CREATININE 1.09* 1.00 0.87  CALCIUM 10.5*  --  9.8   GFR CrCl cannot be calculated (Unknown ideal weight.). Liver Function Tests: Recent Labs  Lab 06/16/17 1633  AST 17  ALT 13*  ALKPHOS 148*  BILITOT 0.9  PROT 7.1  ALBUMIN 3.9    Coagulation profile Recent Labs  Lab 06/16/17 1633  INR 1.06    CBC: Recent Labs  Lab 06/16/17 1633 06/16/17 1706  WBC 6.9  --   NEUTROABS 3.9  --   HGB 14.5 15.3*  HCT 46.5* 45.0  MCV 86.6  --   PLT 196  --    CBG: Recent Labs  Lab 06/16/17 1902  GLUCAP 89   Hgb A1c: Recent Labs    06/17/17 0623  HGBA1C 5.6    Microbiology No results found for this or any previous visit (from the past 240 hour(s)).  Procedures and diagnostic studies:  Ct Angio Head W Or Wo Contrast  Result Date: 06/18/2017 CLINICAL DATA:  Follow-up stroke. EXAM: CT ANGIOGRAPHY HEAD AND NECK TECHNIQUE: Multidetector CT imaging of the head and neck was performed using the standard protocol during bolus administration of intravenous contrast. Multiplanar CT image reconstructions and MIPs were obtained to evaluate the vascular anatomy. Carotid stenosis measurements (when applicable) are obtained utilizing NASCET criteria, using the distal internal carotid diameter as the denominator. CONTRAST:  57mL ISOVUE-370 IOPAMIDOL (ISOVUE-370) INJECTION 76% COMPARISON:  MRI/MRA head Jun 17, 2017 FINDINGS: CT HEAD FINDINGS BRAIN: No intraparenchymal hemorrhage, mass effect nor midline shift. Evolving LEFT parietoccipital lobe acute infarcts. RIGHT occipital lobe encephalomalacia. LEFT basal ganglia lacunar infarct. No parenchymal brain volume loss for age. Patchy supratentorial white matter hypodensities compatible with mild to moderate chronic small vessel ischemic changes. No abnormal extra-axial fluid collections. Basal cisterns are patent. VASCULAR: Moderate calcific atherosclerosis of the carotid siphons. SKULL: No skull fracture. Expanded coarsened LEFT greater than RIGHT calvarium suggesting Paget's disease. 1 cm enchondroma or hemangioma LEFT frontal calvarium. Moderate to severe RIGHT and moderate LEFT temporomandibular osteoarthrosis. No significant scalp soft tissue swelling. Small LEFT frontal scalp lipoma.  SINUSES/ORBITS: The mastoid air-cells and included paranasal sinuses are well-aerated.The included ocular globes and orbital contents are non-suspicious. OTHER: None. CTA NECK FINDINGS: AORTIC ARCH: 3.9 cm aortic arch. Patent three-vessel arch. Severe luminal irregularity bilateral subclavian arteries with moderate stenosis on the LEFT. RIGHT CAROTID SYSTEM: Common carotid artery is patent. Less than 50% stenosis RIGHT internal carotid artery by NASCET criteria. Patent internal carotid artery's mild calcific atherosclerosis. LEFT CAROTID SYSTEM: Common carotid artery is patent. 1 cm segment critical stenosis LEFT ICA origin with string sign. Patent internal carotid artery. Tiny probable infundibulum distal LEFT cervical ICA versus atherosclerosis. VERTEBRAL ARTERIES:Severe stenosis bilateral vertebral artery origins. RIGHT vertebral artery is dominant. Moderate luminal irregularity bilateral vertebral artery's. SKELETON: No acute osseous process though bone windows have not been submitted. Upper lumbar levoscoliosis. Severe upper cervical spondylosis. OTHER NECK: Soft tissues of the neck are nonacute though, not tailored for evaluation. 3.6 cm RIGHT thyroid nodule with substernal extent. Homogeneously hypodense mass contiguous with LEFT inferior thyroid measures at least 2.5 x 2.5 cm with mass effect on the esophagus. UPPER CHEST: Included lung apices are clear. No superior carotid artery. Lymphadenopathy. CTA HEAD FINDINGS: ANTERIOR CIRCULATION: Patent cervical internal  carotid arteries, petrous, cavernous and supra clinoid internal carotid arteries. Moderate stenosis LEFT cavernous ICA. Wide necked blister aneurysm and fairly directed RIGHT cavernous ICA. Moderate stenosis RIGHT cavernous and anterior genu with mild poststenotic dilatation RIGHT supraclinoid ICA. Patent anterior communicating artery. Patent anterior and middle cerebral arteries. Moderate stenosis LEFT M1 segment. Paucity of mid to distal RIGHT MCA  vessels. No large vessel occlusion, contrast extravasation. POSTERIOR CIRCULATION: LEFT V4 occlusion distal to PICA origin. Occluded distal RIGHT V4 segment. Severe tandem stenosis basilar artery with focal potential occlusion distal basilar artery. Patent basilar tip. Severe tandem stenosis bilateral posterior cerebral arteries. Bilateral posterior communicating arteries present. No  contrast extravasation or aneurysm. VENOUS SINUSES: Major dural venous sinuses are patent though not tailored for evaluation on this angiographic examination. ANATOMIC VARIANTS: None. DELAYED PHASE: Not performed. MIP images reviewed. IMPRESSION: CT HEAD: 1. Evolving acute LEFT parieto-occipital lobe infarcts without hemorrhagic conversion. 2. Old RIGHT occipital lobe/PCA territory infarct. Old LEFT basal ganglia infarct. CTA NECK: 1. Critical stenosis LEFT ICA origin. Less than 50% stenosis RIGHT ICA. 2. Severe stenosis bilateral vertebral artery origins. 3. **An incidental finding of potential clinical significance has been found. 3.6 cm RIGHT thyroid nodule. LEFT thyroid mass, lymphadenopathy or parathyroid adenoma. Recommend thyroid ultrasound on NONEMERGENT basis. This follows ACR consensus guidelines: Managing Incidental Thyroid Nodules Detected on Imaging: White Paper of the ACR Incidental Thyroid Findings Committee. J Am Coll Radiol 2015; 12:143-150.** CTA HEAD: 1. Occluded bilateral V4 segments with severe stenosis and transient occlusion basilar artery. Severe stenoses bilateral PCA. 2. Attenuated RIGHT MCA mid to distal segments. Moderate stenosis LEFT M1 segment. 3. Moderate stenosis bilateral ICA. RIGHT cavernous ICA blister aneurysm. Electronically Signed   By: Elon Alas M.D.   On: 06/18/2017 04:32   Ct Head Wo Contrast  Result Date: 06/16/2017 CLINICAL DATA:  84 year old with focal neuro deficit. Sudden visual loss in both eyes. EXAM: CT HEAD WITHOUT CONTRAST TECHNIQUE: Contiguous axial images were obtained  from the base of the skull through the vertex without intravenous contrast. COMPARISON:  None. FINDINGS: Brain: Negative for acute hemorrhage, mass lesion, midline shift, hydrocephalus or new large infarct. Atrophy and encephalomalacia in the right temporal lobe. Focal encephalomalacia involving the right parietal and right occipital lobe. Mild ex vacuo dilatation of the right temporal horn. Vascular: No hyperdense vessel or unexpected calcification. Skull: Normal. Negative for fracture or focal lesion. Sinuses/Orbits: Negative Other: Evidence for a scalp lipoma along the left lateral forehead. IMPRESSION: No acute intracranial abnormality. 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 Electronically Signed   By: Markus Daft M.D.   On: 06/16/2017 17:58   Ct Angio Neck W Or Wo Contrast  Result Date: 06/18/2017 CLINICAL DATA:  Follow-up stroke. EXAM: CT ANGIOGRAPHY HEAD AND NECK TECHNIQUE: Multidetector CT imaging of the head and neck was performed using the standard protocol during bolus administration of intravenous contrast. Multiplanar CT image reconstructions and MIPs were obtained to evaluate the vascular anatomy. Carotid stenosis measurements (when applicable) are obtained utilizing NASCET criteria, using the distal internal carotid diameter as the denominator. CONTRAST:  59mL ISOVUE-370 IOPAMIDOL (ISOVUE-370) INJECTION 76% COMPARISON:  MRI/MRA head Jun 17, 2017 FINDINGS: CT HEAD FINDINGS BRAIN: No intraparenchymal hemorrhage, mass effect nor midline shift. Evolving LEFT parietoccipital lobe acute infarcts. RIGHT occipital lobe encephalomalacia. LEFT basal ganglia lacunar infarct. No parenchymal brain volume loss for age. Patchy supratentorial white matter hypodensities compatible with mild to moderate chronic small vessel ischemic changes. No abnormal extra-axial fluid collections. Basal cisterns are patent. VASCULAR: Moderate calcific atherosclerosis  of the carotid siphons. SKULL: No skull fracture. Expanded coarsened LEFT  greater than RIGHT calvarium suggesting Paget's disease. 1 cm enchondroma or hemangioma LEFT frontal calvarium. Moderate to severe RIGHT and moderate LEFT temporomandibular osteoarthrosis. No significant scalp soft tissue swelling. Small LEFT frontal scalp lipoma. SINUSES/ORBITS: The mastoid air-cells and included paranasal sinuses are well-aerated.The included ocular globes and orbital contents are non-suspicious. OTHER: None. CTA NECK FINDINGS: AORTIC ARCH: 3.9 cm aortic arch. Patent three-vessel arch. Severe luminal irregularity bilateral subclavian arteries with moderate stenosis on the LEFT. RIGHT CAROTID SYSTEM: Common carotid artery is patent. Less than 50% stenosis RIGHT internal carotid artery by NASCET criteria. Patent internal carotid artery's mild calcific atherosclerosis. LEFT CAROTID SYSTEM: Common carotid artery is patent. 1 cm segment critical stenosis LEFT ICA origin with string sign. Patent internal carotid artery. Tiny probable infundibulum distal LEFT cervical ICA versus atherosclerosis. VERTEBRAL ARTERIES:Severe stenosis bilateral vertebral artery origins. RIGHT vertebral artery is dominant. Moderate luminal irregularity bilateral vertebral artery's. SKELETON: No acute osseous process though bone windows have not been submitted. Upper lumbar levoscoliosis. Severe upper cervical spondylosis. OTHER NECK: Soft tissues of the neck are nonacute though, not tailored for evaluation. 3.6 cm RIGHT thyroid nodule with substernal extent. Homogeneously hypodense mass contiguous with LEFT inferior thyroid measures at least 2.5 x 2.5 cm with mass effect on the esophagus. UPPER CHEST: Included lung apices are clear. No superior carotid artery. Lymphadenopathy. CTA HEAD FINDINGS: ANTERIOR CIRCULATION: Patent cervical internal carotid arteries, petrous, cavernous and supra clinoid internal carotid arteries. Moderate stenosis LEFT cavernous ICA. Wide necked blister aneurysm and fairly directed RIGHT cavernous  ICA. Moderate stenosis RIGHT cavernous and anterior genu with mild poststenotic dilatation RIGHT supraclinoid ICA. Patent anterior communicating artery. Patent anterior and middle cerebral arteries. Moderate stenosis LEFT M1 segment. Paucity of mid to distal RIGHT MCA vessels. No large vessel occlusion, contrast extravasation. POSTERIOR CIRCULATION: LEFT V4 occlusion distal to PICA origin. Occluded distal RIGHT V4 segment. Severe tandem stenosis basilar artery with focal potential occlusion distal basilar artery. Patent basilar tip. Severe tandem stenosis bilateral posterior cerebral arteries. Bilateral posterior communicating arteries present. No  contrast extravasation or aneurysm. VENOUS SINUSES: Major dural venous sinuses are patent though not tailored for evaluation on this angiographic examination. ANATOMIC VARIANTS: None. DELAYED PHASE: Not performed. MIP images reviewed. IMPRESSION: CT HEAD: 1. Evolving acute LEFT parieto-occipital lobe infarcts without hemorrhagic conversion. 2. Old RIGHT occipital lobe/PCA territory infarct. Old LEFT basal ganglia infarct. CTA NECK: 1. Critical stenosis LEFT ICA origin. Less than 50% stenosis RIGHT ICA. 2. Severe stenosis bilateral vertebral artery origins. 3. **An incidental finding of potential clinical significance has been found. 3.6 cm RIGHT thyroid nodule. LEFT thyroid mass, lymphadenopathy or parathyroid adenoma. Recommend thyroid ultrasound on NONEMERGENT basis. This follows ACR consensus guidelines: Managing Incidental Thyroid Nodules Detected on Imaging: White Paper of the ACR Incidental Thyroid Findings Committee. J Am Coll Radiol 2015; 12:143-150.** CTA HEAD: 1. Occluded bilateral V4 segments with severe stenosis and transient occlusion basilar artery. Severe stenoses bilateral PCA. 2. Attenuated RIGHT MCA mid to distal segments. Moderate stenosis LEFT M1 segment. 3. Moderate stenosis bilateral ICA. RIGHT cavernous ICA blister aneurysm. Electronically Signed    By: Elon Alas M.D.   On: 06/18/2017 04:32   Mr Brain Wo Contrast  Result Date: 06/17/2017 CLINICAL DATA:  84 y/o F; transient episode of headache and vision loss 06/13/2017 with return of vision loss. EXAM: MRI HEAD WITHOUT CONTRAST MRA HEAD WITHOUT CONTRAST TECHNIQUE: Multiplanar, multiecho pulse sequences of the brain and surrounding structures  were obtained without intravenous contrast. Angiographic images of the head were obtained using MRA technique without contrast. COMPARISON:  06/16/2017 CT head. FINDINGS: MRI HEAD FINDINGS Brain: Area of reduced diffusion within the left posterior parietal and occipital lobes compatible with acute/early subacute infarction. There are few additional scattered punctate foci in the left superolateral frontal lobe and left lateral temporal lobe. No associated hemorrhage or mass effect. Chronic infarcts in the right occipital lobe. Siderosis of the right superior central sulcus. No focal mass effect, extra-axial collection, hydrocephalus, herniation. Several nonspecific foci of T2 FLAIR hyperintense signal abnormality in subcortical and periventricular white matter are compatible with moderate chronic microvascular ischemic changes for age. Moderate brain parenchymal volume loss. Vascular: Normal flow voids. Skull and upper cervical spine: Normal marrow signal. Sinuses/Orbits: Mild paranasal sinus mucosal thickening. No abnormal signal of mastoid air cells. Orbits are unremarkable. Other: None. MRA HEAD FINDINGS Internal carotid arteries: Patent. Lumen irregularity of bilateral carotid siphons likely representing atherosclerotic plaque. Mild right paraclinoid and left distal cavernous stenosis. Mild stenosis of the left horizontal petrous ICA. Anterior cerebral arteries:  Patent. Middle cerebral arteries: Patent. Anterior communicating artery: Patent. Posterior communicating arteries:  Bilateral fetal PCA. Posterior cerebral arteries: Patent. Multiple segments of  stenosis in bilateral PCA. Tandem segments of right P1 and P2 stenosis. Long segment of left P2 stenosis. (Series 505, image 14) Basilar artery: Small caliber vertebrobasilar system due to bilateral fetal PCA anatomy. Multiple segments of severe stenosis of the basilar artery. Vertebral arteries:  Patent. No aneurysm or large vessel occlusion identified. IMPRESSION: 1. Acute/early subacute infarct involving the left posterior parietal and occipital lobes. No associated hemorrhage or mass effect. Few additional punctate foci are present within left frontal and temporal lobes. 2. Chronic infarct of right occipital lobe. 3. Moderate chronic microvascular ischemic changes and parenchymal volume loss of the brain. 4. Patent anterior and posterior intracranial circulation. 5. 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 Multiple segments of mild stenosis of the internal carotid arteries. These results will be called to the ordering clinician or representative by the Radiologist Assistant, and communication documented in the PACS or zVision Dashboard. Electronically Signed   By: Kristine Garbe M.D.   On: 06/17/2017 14:28   Mr Jodene Nam Head Wo Contrast  Result Date: 06/17/2017 CLINICAL DATA:  84 y/o F; transient episode of headache and vision loss 06/13/2017 with return of vision loss. EXAM: MRI HEAD WITHOUT CONTRAST MRA HEAD WITHOUT CONTRAST TECHNIQUE: Multiplanar, multiecho pulse sequences of the brain and surrounding structures were obtained without intravenous contrast. Angiographic images of the head were obtained using MRA technique without contrast. COMPARISON:  06/16/2017 CT head. FINDINGS: MRI HEAD FINDINGS Brain: Area of reduced diffusion within the left posterior parietal and occipital lobes compatible with acute/early subacute infarction. There are few additional scattered punctate foci in the left superolateral frontal lobe and left lateral  temporal lobe. No associated hemorrhage or mass effect. Chronic infarcts in the right occipital lobe. Siderosis of the right superior central sulcus. No focal mass effect, extra-axial collection, hydrocephalus, herniation. Several nonspecific foci of T2 FLAIR hyperintense signal abnormality in subcortical and periventricular white matter are compatible with moderate chronic microvascular ischemic changes for age. Moderate brain parenchymal volume loss. Vascular: Normal flow voids. Skull and upper cervical spine: Normal marrow signal. Sinuses/Orbits: Mild paranasal sinus mucosal thickening. No abnormal signal of mastoid air cells. Orbits are unremarkable. Other: None. MRA HEAD FINDINGS Internal carotid arteries: Patent. Lumen irregularity of bilateral carotid siphons  likely representing atherosclerotic plaque. Mild right paraclinoid and left distal cavernous stenosis. Mild stenosis of the left horizontal petrous ICA. Anterior cerebral arteries:  Patent. Middle cerebral arteries: Patent. Anterior communicating artery: Patent. Posterior communicating arteries:  Bilateral fetal PCA. Posterior cerebral arteries: Patent. Multiple segments of stenosis in bilateral PCA. Tandem segments of right P1 and P2 stenosis. Long segment of left P2 stenosis. (Series 505, image 14) Basilar artery: Small caliber vertebrobasilar system due to bilateral fetal PCA anatomy. Multiple segments of severe stenosis of the basilar artery. Vertebral arteries:  Patent. No aneurysm or large vessel occlusion identified. IMPRESSION: 1. Acute/early subacute infarct involving the left posterior parietal and occipital lobes. No associated hemorrhage or mass effect. Few additional punctate foci are present within left frontal and temporal lobes. 2. Chronic infarct of right occipital lobe. 3. Moderate chronic microvascular ischemic changes and parenchymal volume loss of the brain. 4. Patent anterior and posterior intracranial circulation. 5. Multiple  segments of severe stenosis of the basilar artery and bilateral posterior cerebral arteries. Multiple segments of mild stenosis of the internal carotid arteries. These results will be called to the ordering clinician or representative by the Radiologist Assistant, and communication documented in the PACS or zVision Dashboard. Electronically Signed   By: Kristine Garbe M.D.   On: 06/17/2017 14:28    Medications:   . aspirin  300 mg Rectal Daily   Or  . aspirin  325 mg Oral Daily  . atorvastatin  80 mg Oral q1800  . clopidogrel  75 mg Oral Daily  . enoxaparin (LOVENOX) injection  40 mg Subcutaneous Q24H  . latanoprost  1 drop Both Eyes QHS  . mometasone-formoterol  2 puff Inhalation Daily   Continuous Infusions: . lactated ringers       LOS: 0 days   Jacquelynn Cree  [facility] Pager [phone]. If unable to reach me by pager, please call my cell phone at [phone].  *Please refer to [email or web address], password [email or web address] to get updated schedule on who will round on this patient, as hospitalists switch teams weekly. If 7PM-7AM, please contact night-coverage at [email or web address], password [email or web address] for any overnight needs.  06/18/2017, 2:08 PM

## 2017-06-18 NOTE — Progress Notes (Signed)
OT Cancellation Note  Patient Details Name: Stacie Bennett MRN: [id number] DOB: 05/21/1927   Cancelled Treatment:    Reason Eval/Treat Not Completed: Patient at procedure or test/ unavailable(Echo)  Stacie Bennett 06/18/2017, 8:52 AM  Stacie Bennett OTR/L [phone]

## 2017-06-18 NOTE — Progress Notes (Signed)
P[t pick up for echo this am

## 2017-06-19 ENCOUNTER — Other Ambulatory Visit: Payer: Self-pay

## 2017-06-19 ENCOUNTER — Encounter (HOSPITAL_COMMUNITY): Payer: Self-pay | Admitting: General Practice

## 2017-06-19 DIAGNOSIS — R29705 NIHSS score 5: Secondary | ICD-10-CM | POA: Diagnosis not present

## 2017-06-19 DIAGNOSIS — E782 Mixed hyperlipidemia: Secondary | ICD-10-CM | POA: Diagnosis not present

## 2017-06-19 DIAGNOSIS — I63532 Cerebral infarction due to unspecified occlusion or stenosis of left posterior cerebral artery: Secondary | ICD-10-CM | POA: Diagnosis present

## 2017-06-19 DIAGNOSIS — I639 Cerebral infarction, unspecified: Secondary | ICD-10-CM | POA: Diagnosis present

## 2017-06-19 DIAGNOSIS — E079 Disorder of thyroid, unspecified: Secondary | ICD-10-CM | POA: Diagnosis present

## 2017-06-19 DIAGNOSIS — H543 Unqualified visual loss, both eyes: Secondary | ICD-10-CM | POA: Diagnosis present

## 2017-06-19 DIAGNOSIS — F0390 Unspecified dementia without behavioral disturbance: Secondary | ICD-10-CM | POA: Diagnosis present

## 2017-06-19 DIAGNOSIS — E785 Hyperlipidemia, unspecified: Secondary | ICD-10-CM | POA: Diagnosis present

## 2017-06-19 DIAGNOSIS — Z888 Allergy status to other drugs, medicaments and biological substances status: Secondary | ICD-10-CM | POA: Diagnosis not present

## 2017-06-19 DIAGNOSIS — E669 Obesity, unspecified: Secondary | ICD-10-CM | POA: Diagnosis present

## 2017-06-19 DIAGNOSIS — H53123 Transient visual loss, bilateral: Secondary | ICD-10-CM | POA: Diagnosis present

## 2017-06-19 DIAGNOSIS — H53133 Sudden visual loss, bilateral: Secondary | ICD-10-CM | POA: Diagnosis not present

## 2017-06-19 DIAGNOSIS — Z90710 Acquired absence of both cervix and uterus: Secondary | ICD-10-CM | POA: Diagnosis not present

## 2017-06-19 DIAGNOSIS — Z881 Allergy status to other antibiotic agents status: Secondary | ICD-10-CM | POA: Diagnosis not present

## 2017-06-19 DIAGNOSIS — I739 Peripheral vascular disease, unspecified: Secondary | ICD-10-CM | POA: Diagnosis present

## 2017-06-19 DIAGNOSIS — M069 Rheumatoid arthritis, unspecified: Secondary | ICD-10-CM | POA: Diagnosis present

## 2017-06-19 DIAGNOSIS — Z8249 Family history of ischemic heart disease and other diseases of the circulatory system: Secondary | ICD-10-CM | POA: Diagnosis not present

## 2017-06-19 DIAGNOSIS — R29706 NIHSS score 6: Secondary | ICD-10-CM | POA: Diagnosis not present

## 2017-06-19 DIAGNOSIS — G9340 Encephalopathy, unspecified: Secondary | ICD-10-CM | POA: Diagnosis present

## 2017-06-19 DIAGNOSIS — I160 Hypertensive urgency: Secondary | ICD-10-CM | POA: Diagnosis present

## 2017-06-19 DIAGNOSIS — R7989 Other specified abnormal findings of blood chemistry: Secondary | ICD-10-CM

## 2017-06-19 DIAGNOSIS — R29702 NIHSS score 2: Secondary | ICD-10-CM | POA: Diagnosis present

## 2017-06-19 DIAGNOSIS — Z8673 Personal history of transient ischemic attack (TIA), and cerebral infarction without residual deficits: Secondary | ICD-10-CM | POA: Diagnosis not present

## 2017-06-19 DIAGNOSIS — I63412 Cerebral infarction due to embolism of left middle cerebral artery: Secondary | ICD-10-CM | POA: Diagnosis not present

## 2017-06-19 DIAGNOSIS — I6523 Occlusion and stenosis of bilateral carotid arteries: Secondary | ICD-10-CM | POA: Diagnosis present

## 2017-06-19 DIAGNOSIS — H409 Unspecified glaucoma: Secondary | ICD-10-CM | POA: Diagnosis present

## 2017-06-19 DIAGNOSIS — I10 Essential (primary) hypertension: Secondary | ICD-10-CM | POA: Diagnosis present

## 2017-06-19 DIAGNOSIS — F40240 Claustrophobia: Secondary | ICD-10-CM | POA: Diagnosis present

## 2017-06-19 DIAGNOSIS — Z6831 Body mass index (BMI) 31.0-31.9, adult: Secondary | ICD-10-CM | POA: Diagnosis not present

## 2017-06-19 DIAGNOSIS — J449 Chronic obstructive pulmonary disease, unspecified: Secondary | ICD-10-CM | POA: Diagnosis present

## 2017-06-19 HISTORY — DX: Hyperlipidemia, unspecified: E78.5

## 2017-06-19 LAB — T4, FREE: Free T4: 1.27 ng/dL (ref 0.82–1.77)

## 2017-06-19 MED ORDER — ATORVASTATIN CALCIUM 80 MG PO TABS: 80.0000 mg | ORAL_TABLET | Freq: Every day | ORAL | 2 refills | Status: DC

## 2017-06-19 MED ORDER — ASPIRIN EC 81 MG PO TBEC: 81.0000 mg | DELAYED_RELEASE_TABLET | Freq: Every day | ORAL | 2 refills | Status: DC

## 2017-06-19 MED ORDER — CLOPIDOGREL BISULFATE 75 MG PO TABS: 75.0000 mg | ORAL_TABLET | Freq: Every day | ORAL | 2 refills | Status: DC

## 2017-06-19 MED ORDER — ASPIRIN 325 MG PO TBEC: 325.0000 mg | DELAYED_RELEASE_TABLET | Freq: Every day | ORAL | 2 refills | Status: DC

## 2017-06-19 MED ORDER — ASPIRIN 325 MG PO TBEC: 325.0000 mg | DELAYED_RELEASE_TABLET | Freq: Every day | ORAL | 0 refills | Status: DC

## 2017-06-19 NOTE — Progress Notes (Signed)
Occupational Therapy Treatment Patient Details Name: Stacie Bennett MRN: [id number] DOB: Nov 15, 1927 Today's Date: 06/19/2017    History of present illness Pt is an 84 y.o. woman with PMH of anxiety, HTN, PVD, RA dementia, admitted 06/16/17 with c/o of bilateral visual loss that started with a headache on Friday (5/17). 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    OT comments  Educated pt/family on compensatory strategies for low vision to increase independence with ADL and reduce risk of falls. Given contact number for [facility]. Recommend follow up with [facility].   Follow Up Recommendations  Home health OT;Supervision/Assistance - 24 hour    Equipment Recommendations  None recommended by OT    Recommendations for Other Services      Precautions / Restrictions Precautions Precautions: Fall Precaution Comments: impaired vision Restrictions Weight Bearing Restrictions: No       Mobility Bed Mobility Overal bed mobility: Independent             General bed mobility comments: Indep to long sit and move to EOB  Transfers Overall transfer level: Needs assistance Equipment used: Rolling walker (2 wheeled) Transfers: Sit to/from Stand Sit to Stand: Supervision         General transfer comment: HHA for mobility/pt furniture walking    Balance Overall balance assessment: Needs assistance Sitting-balance support: No upper extremity supported Sitting balance-Leahy Scale: Good   Postural control: Right lateral lean     Standing balance comment: Reliant on UE support while standing at sink brushing teeth                           ADL either performed or assessed with clinical judgement   ADL                                               Vision   Additional Comments: Educated pt/son on use of compensatory techniques for low  vision. Family given contact information for [facility]. Educated on home safety to reduce risk of falls. Discussed importance of not rearranging objects that the pt uses and to increase the contrast around these objexts to increase the pt's ability to function in her familiar environment. Son verbalized understanding. Educated on strategies to reduce risk of falls   Perception     Praxis      Cognition Arousal/Alertness: Awake/alert Behavior During Therapy: WFL for tasks assessed/performed Overall Cognitive Status: History of cognitive impairments - at baseline Area of Impairment: Orientation;Attention;Memory;Following commands;Safety/judgement;Awareness;Problem solving                 Orientation Level: Disoriented to;Time Current Attention Level: Sustained Memory: Decreased short-term memory Following Commands: Follows multi-step commands with increased time Safety/Judgement: Decreased awareness of deficits Awareness: Emergent Problem Solving: Slow processing;Difficulty sequencing;Requires verbal cues General Comments: Per son, getting close to baseline        Exercises     Shoulder Instructions       General Comments Son present at end of session    Pertinent Vitals/ Pain       Pain Assessment: No/denies pain  Home Living  Prior Functioning/Environment              Frequency  Min 2X/week        Progress Toward Goals  OT Goals(current goals can now be found in the care plan section)  Progress towards OT goals: Progressing toward goals  Acute Rehab OT Goals Patient Stated Goal: Return home OT Goal Formulation: With patient/family Time For Goal Achievement: 07/01/17 Potential to Achieve Goals: Good ADL Goals Pt Will Perform Upper Body Bathing: with modified independence Pt Will Perform Lower Body Bathing: with modified independence Pt Will Perform Upper Body Dressing: with  modified independence Pt Will Perform Lower Body Dressing: with modified independence Pt Will Transfer to Toilet: with modified independence Pt Will Perform Toileting - Clothing Manipulation and hygiene: with modified independence Additional ADL Goal #1: Pt will visually scan environment to locate items with min cues to complete self-care task.  Plan Discharge plan remains appropriate;Frequency remains appropriate    Co-evaluation                 AM-PAC PT "6 Clicks" Daily Activity     Outcome Measure   Help from another person eating meals?: A Little Help from another person taking care of personal grooming?: A Little Help from another person toileting, which includes using toliet, bedpan, or urinal?: A Little Help from another person bathing (including washing, rinsing, drying)?: A Little Help from another person to put on and taking off regular upper body clothing?: A Little Help from another person to put on and taking off regular lower body clothing?: A Little 6 Click Score: 18    End of Session Equipment Utilized During Treatment: Gait belt  OT Visit Diagnosis: Low vision, both eyes (H54.2);Unsteadiness on feet (R26.81);Other abnormalities of gait and mobility (R26.89)   Activity Tolerance Patient tolerated treatment well   Patient Left in chair;with call bell/phone within reach;with family/visitor present   Nurse Communication Mobility status        Time: 9675-9163 OT Time Calculation (min): 32 min  Charges: OT General Charges $OT Visit: 1 Visit OT Treatments $Self Care/Home Management : 23-37 mins  Maurie Boettcher, OT/L  OT Clinical Specialist [phone]    [facility] 06/19/2017, 2:12 PM

## 2017-06-19 NOTE — Discharge Summary (Signed)
Physician Discharge Summary  Stacie Bennett IOE:[id number] DOB: 11-28-1927 DOA: 06/16/2017  PCP: Nolene Ebbs, MD  Admit date: 06/16/2017 Discharge date: 06/19/2017  Admitted From: Home Discharge disposition: Home   Recommendations for Outpatient Follow-Up:   1. Needs F/U on abnormal thyroid ultrasound, low TSH. Free T4 pending. Family may opt to not pursue further diagnostic evaluation.   Discharge Diagnosis:   Principal Problem:   Cerebral embolism with cerebral infarction Active Problems:   COPD (chronic obstructive pulmonary disease) (HCC)   Hypertension   Vision loss, bilateral   Acute loss of vision, bilateral   Thyroid mass   Visual loss   Stenosis of left carotid artery   Low TSH level   Hyperlipidemia  Discharge Condition: Stable.  Diet recommendation: Low sodium, heart healthy.    Code status: Full.   History of Present Illness:   Stacie Bennett is an 84 y.o. female with a PMH of COPD, cataracts, glaucoma, rheumatoid arthritis, dementia and hypertension who was admitted 06/16/2017 for evaluation of bilateral vision loss associated with mild headache.  Evaluated by her ophthalmologist who directed her to the ED out of concern for stroke.  Initial CT negative for acute intracranial abnormality.  Hospital Course by Problem:   Principal Problem:   Vision loss, bilateral secondary to acute/subacute left posterior and occipital lobe infarcts in a patient with prior history of stroke Initial CT negative for acute findings.  MRI under anesthesia 06/17/2017 confirmed acute/subacute stroke in the left posterior and occipital lobes. Evaluated by neurology.  CTA of head and neck confirmed significant CVD. Family declined intervention given age/dementia and opted for conservative measures. Hemoglobin A1c 5.6%.  Lipid panel showed an LDL of 162.  Continue aspirin 325 mg/Plavix with dual therapy x 3 months then Plavix alone, high-dose statin added.  Will need home health  PT/OT.  Active Problems:   Hyperlipidemia LDL goal < 70.  Discharged on statin.    Thyroid mass Incidentally discovered on CTA head and neck. 2.5 x 2.5 cm with mass effect on the esophagus. Thyroid ultrasound shows multiple nodules, with FNA recommended. TSH low at 0.227. Check FT4. Findings discussed with family who preferred discharge today with outpatient follow up.    COPD (chronic obstructive pulmonary disease) (HCC) Stable.  Continue Dulera and as needed albuterol.    Hypertension Systolic blood pressure in the 120s-160s. Resume Norvasc at discharge (permissive HTN while in the hospital).     Obesity Body mass index is 31.35 kg/m. Healthy diet encouraged.  Medical Consultants:    Neurology.   Discharge Exam:   Vitals:   06/19/17 0408 06/19/17 0757  BP: (!) 141/50 128/78  Pulse: 61 (!) 56  Resp: 20 20  Temp: 97.8 F (36.6 C) 97.7 F (36.5 C)  SpO2: 99% 100%   Vitals:   06/18/17 2022 06/18/17 2129 06/19/17 0408 06/19/17 0757  BP: (!) 161/60 (!) 126/58 (!) 141/50 128/78  Pulse: 66 (!) 57 61 (!) 56  Resp: 20  20 20   Temp: 97.7 F (36.5 C) 98.1 F (36.7 C) 97.8 F (36.6 C) 97.7 F (36.5 C)  TempSrc: Oral Oral Oral Oral  SpO2: 96%  99% 100%  Weight:        General exam: Appears calm and comfortable.  Respiratory system: Clear to auscultation. Respiratory effort normal. Cardiovascular system: S1 & S2 heard, RRR. No JVD,  rubs, gallops or clicks. No murmurs. Gastrointestinal system: Abdomen is nondistended, soft and nontender. No organomegaly or masses felt. Normal bowel sounds heard. Central nervous  system: Alert and oriented. Vision impaired. Extremities: No clubbing,  or cyanosis. No edema. Skin: No rashes, lesions or ulcers. Psychiatry: Judgement and insight appear normal. Mood & affect appropriate.    The results of significant diagnostics from this hospitalization (including imaging, microbiology, ancillary and laboratory) are listed below for  reference.     Procedures and Diagnostic Studies:   Ct Head Wo Contrast  Result Date: 06/16/2017 CLINICAL DATA:  84 year old with focal neuro deficit. Sudden visual loss in both eyes. EXAM: CT HEAD WITHOUT CONTRAST TECHNIQUE: Contiguous axial images were obtained from the base of the skull through the vertex without intravenous contrast. COMPARISON:  None. FINDINGS: Brain: Negative for acute hemorrhage, mass lesion, midline shift, hydrocephalus or new large infarct. Atrophy and encephalomalacia in the right temporal lobe. Focal encephalomalacia involving the right parietal and right occipital lobe. Mild ex vacuo dilatation of the right temporal horn. Vascular: No hyperdense vessel or unexpected calcification. Skull: Normal. Negative for fracture or focal lesion. Sinuses/Orbits: Negative Other: Evidence for a scalp lipoma along the left lateral forehead. IMPRESSION: No acute intracranial abnormality. 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 Electronically Signed   By: Markus Daft M.D.   On: 06/16/2017 17:58   Mr Brain Wo Contrast  Result Date: 06/17/2017 CLINICAL DATA:  84 y/o F; transient episode of headache and vision loss 06/13/2017 with return of vision loss. EXAM: MRI HEAD WITHOUT CONTRAST MRA HEAD WITHOUT CONTRAST TECHNIQUE: Multiplanar, multiecho pulse sequences of the brain and surrounding structures were obtained without intravenous contrast. Angiographic images of the head were obtained using MRA technique without contrast. COMPARISON:  06/16/2017 CT head. FINDINGS: MRI HEAD FINDINGS Brain: Area of reduced diffusion within the left posterior parietal and occipital lobes compatible with acute/early subacute infarction. There are few additional scattered punctate foci in the left superolateral frontal lobe and left lateral temporal lobe. No associated hemorrhage or mass effect. Chronic infarcts in the right occipital lobe. Siderosis of the right superior central sulcus. No focal mass effect, extra-axial  collection, hydrocephalus, herniation. Several nonspecific foci of T2 FLAIR hyperintense signal abnormality in subcortical and periventricular white matter are compatible with moderate chronic microvascular ischemic changes for age. Moderate brain parenchymal volume loss. Vascular: Normal flow voids. Skull and upper cervical spine: Normal marrow signal. Sinuses/Orbits: Mild paranasal sinus mucosal thickening. No abnormal signal of mastoid air cells. Orbits are unremarkable. Other: None. MRA HEAD FINDINGS Internal carotid arteries: Patent. Lumen irregularity of bilateral carotid siphons likely representing atherosclerotic plaque. Mild right paraclinoid and left distal cavernous stenosis. Mild stenosis of the left horizontal petrous ICA. Anterior cerebral arteries:  Patent. Middle cerebral arteries: Patent. Anterior communicating artery: Patent. Posterior communicating arteries:  Bilateral fetal PCA. Posterior cerebral arteries: Patent. Multiple segments of stenosis in bilateral PCA. Tandem segments of right P1 and P2 stenosis. Long segment of left P2 stenosis. (Series 505, image 14) Basilar artery: Small caliber vertebrobasilar system due to bilateral fetal PCA anatomy. Multiple segments of severe stenosis of the basilar artery. Vertebral arteries:  Patent. No aneurysm or large vessel occlusion identified. IMPRESSION: 1. Acute/early subacute infarct involving the left posterior parietal and occipital lobes. No associated hemorrhage or mass effect. Few additional punctate foci are present within left frontal and temporal lobes. 2. Chronic infarct of right occipital lobe. 3. Moderate chronic microvascular ischemic changes and parenchymal volume loss of the brain. 4. Patent anterior and posterior intracranial circulation. 5. 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 Multiple segments of mild stenosis of the internal carotid  arteries. These results will be  called to the ordering clinician or representative by the Radiologist Assistant, and communication documented in the PACS or zVision Dashboard. Electronically Signed   By: Kristine Garbe M.D.   On: 06/17/2017 14:28   Mr Jodene Nam Head Wo Contrast  Result Date: 06/17/2017 CLINICAL DATA:  84 y/o F; transient episode of headache and vision loss 06/13/2017 with return of vision loss. EXAM: MRI HEAD WITHOUT CONTRAST MRA HEAD WITHOUT CONTRAST TECHNIQUE: Multiplanar, multiecho pulse sequences of the brain and surrounding structures were obtained without intravenous contrast. Angiographic images of the head were obtained using MRA technique without contrast. COMPARISON:  06/16/2017 CT head. FINDINGS: MRI HEAD FINDINGS Brain: Area of reduced diffusion within the left posterior parietal and occipital lobes compatible with acute/early subacute infarction. There are few additional scattered punctate foci in the left superolateral frontal lobe and left lateral temporal lobe. No associated hemorrhage or mass effect. Chronic infarcts in the right occipital lobe. Siderosis of the right superior central sulcus. No focal mass effect, extra-axial collection, hydrocephalus, herniation. Several nonspecific foci of T2 FLAIR hyperintense signal abnormality in subcortical and periventricular white matter are compatible with moderate chronic microvascular ischemic changes for age. Moderate brain parenchymal volume loss. Vascular: Normal flow voids. Skull and upper cervical spine: Normal marrow signal. Sinuses/Orbits: Mild paranasal sinus mucosal thickening. No abnormal signal of mastoid air cells. Orbits are unremarkable. Other: None. MRA HEAD FINDINGS Internal carotid arteries: Patent. Lumen irregularity of bilateral carotid siphons likely representing atherosclerotic plaque. Mild right paraclinoid and left distal cavernous stenosis. Mild stenosis of the left horizontal petrous ICA. Anterior cerebral arteries:  Patent. Middle  cerebral arteries: Patent. Anterior communicating artery: Patent. Posterior communicating arteries:  Bilateral fetal PCA. Posterior cerebral arteries: Patent. Multiple segments of stenosis in bilateral PCA. Tandem segments of right P1 and P2 stenosis. Long segment of left P2 stenosis. (Series 505, image 14) Basilar artery: Small caliber vertebrobasilar system due to bilateral fetal PCA anatomy. Multiple segments of severe stenosis of the basilar artery. Vertebral arteries:  Patent. No aneurysm or large vessel occlusion identified. IMPRESSION: 1. Acute/early subacute infarct involving the left posterior parietal and occipital lobes. No associated hemorrhage or mass effect. Few additional punctate foci are present within left frontal and temporal lobes. 2. Chronic infarct of right occipital lobe. 3. Moderate chronic microvascular ischemic changes and parenchymal volume loss of the brain. 4. Patent anterior and posterior intracranial circulation. 5. 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 Multiple segments of mild stenosis of the internal carotid arteries. These results will be called to the ordering clinician or representative by the Radiologist Assistant, and communication documented in the PACS or zVision Dashboard. Electronically Signed   By: Kristine Garbe M.D.   On: 06/17/2017 14:28     Labs:   Basic Metabolic Panel: Recent Labs  Lab 06/16/17 1633 06/16/17 1706 06/17/17 0623  NA 140 141 140  K 4.4 4.4 4.0  CL 105 104 107  CO2 27  --  25  GLUCOSE 95 97 93  BUN 13 16 11   CREATININE 1.09* 1.00 0.87  CALCIUM 10.5*  --  9.8   GFR CrCl cannot be calculated (Unknown ideal weight.). Liver Function Tests: Recent Labs  Lab 06/16/17 1633  AST 17  ALT 13*  ALKPHOS 148*  BILITOT 0.9  PROT 7.1  ALBUMIN 3.9   Coagulation profile Recent Labs  Lab 06/16/17 1633  INR 1.06    CBC: Recent Labs  Lab 06/16/17 1633  06/16/17 1706  WBC 6.9  --   NEUTROABS 3.9  --   HGB 14.5 15.3*  HCT 46.5* 45.0  MCV 86.6  --   PLT 196  --    CBG: Recent Labs  Lab 06/16/17 1902  GLUCAP 89   Hgb A1c Recent Labs    06/17/17 0623  HGBA1C 5.6   Lipid Profile Recent Labs    06/17/17 0623  CHOL 228*  HDL 56  LDLCALC 162*  TRIG 49  CHOLHDL 4.1   Thyroid function studies Recent Labs    06/18/17 1417  TSH 0.227*    Discharge Instructions:   Discharge Instructions    Ambulatory referral to Neurology   Complete by:  As directed    Follow up with stroke clinic NP (Jessica Vanschaick or Cecille Rubin, if both not available, consider Zachery Dauer, or [facility]) at [facility] in about 4 weeks. Thanks.   Call MD for:  difficulty breathing, headache or visual disturbances   Complete by:  As directed    Call MD for:  persistant dizziness or light-headedness   Complete by:  As directed    Diet - low sodium heart healthy   Complete by:  As directed    Increase activity slowly   Complete by:  As directed      Allergies as of 06/19/2017      Reactions   Keflex [cephalexin] Other (See Comments)   Unknown reaction   Pletal [cilostazol] Other (See Comments)   Unknown reaction      Medication List    TAKE these medications   acetaminophen 500 MG tablet Commonly known as:  TYLENOL Take 1,000 mg by mouth every 6 (six) hours as needed for headache.   albuterol 108 (90 Base) MCG/ACT inhaler Commonly known as:  PROVENTIL HFA;VENTOLIN HFA Inhale 2 puffs into the lungs every 6 (six) hours as needed for wheezing or shortness of breath.   amLODipine 5 MG tablet Commonly known as:  NORVASC Take 5 mg by mouth daily.   aspirin 325 MG EC tablet Take 1 tablet (325 mg total) by mouth daily. Start taking on:  06/20/2017   atorvastatin 80 MG tablet Commonly known as:  LIPITOR Take 1 tablet (80 mg total) by mouth daily at 6 PM.   clopidogrel 75 MG tablet Commonly known as:  PLAVIX Take 1 tablet (75 mg total) by  mouth daily. Start taking on:  06/20/2017   latanoprost 0.005 % ophthalmic solution Commonly known as:  XALATAN Place 1 drop into both eyes at bedtime.   meclizine 25 MG tablet Commonly known as:  ANTIVERT Take 25 mg by mouth every 8 (eight) hours as needed for dizziness.   mometasone-formoterol 100-5 MCG/ACT Aero Commonly known as:  DULERA Inhale 2 puffs into the lungs daily.      Follow-up Information    [facility]. Schedule an appointment as soon as possible for a visit in 4 week(s).   Specialty:  Radiology Contact information: [address] [place] [phone]       Nolene Ebbs, MD Follow up in 1 week(s).   Specialty:  Internal Medicine Why:  To follow up on thyroid issues. Contact information: [place] [address] [phone]            Time coordinating discharge: 35 minutes.  SignedMargreta Journey Kohlton Gilpatrick  Pager [phone] [facility] 06/19/2017, 10:47 AM

## 2017-06-19 NOTE — Progress Notes (Signed)
Physical Therapy Treatment Patient Details Name: Stacie Bennett MRN: [id number] DOB: 02/19/1927 Today's Date: 06/19/2017    History of Present Illness Pt is an 84 y.o. woman with PMH of anxiety, HTN, PVD, RA dementia, admitted 06/16/17 with c/o of bilateral visual loss that started with a headache on Friday (5/17). 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    PT Comments    Pt progressing with mobility. Able to perform functional tasks around room and hallway ambulation with RW and intermittent min guard for safety. Reports no change in visual deficits, still only seeing "shadows," although demonstrates good ability to navigate around large objects in hallway. Difficulty running into objects on floor, but able to navigate around with cues. Son present during session, reports no questions or concerns; educ son on need for supervision and intermittent cues for safety at home (I.e. Ensuring pt uses RW). Both pt and family anxious to return home today.     Follow Up Recommendations  Home health PT;Supervision/Assistance - 24 hour     Equipment Recommendations  Rolling walker with 5" wheels    Recommendations for Other Services       Precautions / Restrictions Precautions Precautions: Fall;Other (comment) Precaution Comments: impaired vision Restrictions Weight Bearing Restrictions: No    Mobility  Bed Mobility Overal bed mobility: Independent             General bed mobility comments: Indep to long sit and move to EOB  Transfers Overall transfer level: Needs assistance Equipment used: Rolling walker (2 wheeled) Transfers: Sit to/from Stand Sit to Stand: Supervision            Ambulation/Gait Ambulation/Gait assistance: Min guard;Supervision Ambulation Distance (Feet): 200 Feet Assistive device: Rolling walker (2 wheeled) Gait Pattern/deviations: Step-through pattern;Decreased  stride length;Drifts right/left Gait velocity: Decreased Gait velocity interpretation: <1.31 ft/sec, indicative of household ambulator General Gait Details: Intermittent min guard for safety/balance while ambulating and navigating room with RW; pt with good ability to avoid large objects in path without cues. Running into objects on floor with RW in room and becoming frustrated, able to negotiate with cues to slow down and take time   Stairs             Wheelchair Mobility    Modified Rankin (Stroke Patients Only) Modified Rankin (Stroke Patients Only) Pre-Morbid Rankin Score: No symptoms Modified Rankin: Moderate disability     Balance Overall balance assessment: Needs assistance Sitting-balance support: No upper extremity supported Sitting balance-Leahy Scale: Good   Postural control: Right lateral lean     Standing balance comment: Reliant on UE support while standing at sink brushing teeth                            Cognition Arousal/Alertness: Awake/alert Behavior During Therapy: WFL for tasks assessed/performed Overall Cognitive Status: History of cognitive impairments - at baseline Area of Impairment: Orientation;Attention;Memory;Following commands;Safety/judgement;Awareness;Problem solving                 Orientation Level: Disoriented to;Time Current Attention Level: Sustained Memory: Decreased short-term memory Following Commands: Follows multi-step commands with increased time Safety/Judgement: Decreased awareness of deficits Awareness: Emergent Problem Solving: Slow processing;Difficulty sequencing;Requires verbal cues General Comments: Pt asking multiple times when she can go home and if I can take IV out of arm. Did not recall PT/OT session yesterday. Poor safety recall, i.e. when asked what equipment she needs  to walk, pt states, "Nothing I walk just fine"; able to recall RW use with cues      Exercises      General Comments  General comments (skin integrity, edema, etc.): Son present at end of session      Pertinent Vitals/Pain Pain Assessment: No/denies pain    Home Living Family/patient expects to be discharged to:: Private residence Living Arrangements: Children;Other relatives                  Prior Function            PT Goals (current goals can now be found in the care plan section) Acute Rehab PT Goals Patient Stated Goal: Return home PT Goal Formulation: With patient/family Time For Goal Achievement: 07/02/17 Potential to Achieve Goals: Good Progress towards PT goals: Progressing toward goals    Frequency    Min 4X/week      PT Plan Current plan remains appropriate    Co-evaluation              AM-PAC PT "6 Clicks" Daily Activity  Outcome Measure  Difficulty turning over in bed (including adjusting bedclothes, sheets and blankets)?: None Difficulty moving from lying on back to sitting on the side of the bed? : None Difficulty sitting down on and standing up from a chair with arms (e.g., wheelchair, bedside commode, etc,.)?: A Little Help needed moving to and from a bed to chair (including a wheelchair)?: A Little Help needed walking in hospital room?: A Little Help needed climbing 3-5 steps with a railing? : A Little 6 Click Score: 20    End of Session Equipment Utilized During Treatment: Gait belt Activity Tolerance: Patient tolerated treatment well Patient left: in chair;with call bell/phone within reach;with family/visitor present Nurse Communication: Mobility status PT Visit Diagnosis: Other abnormalities of gait and mobility (R26.89);Other symptoms and signs involving the nervous system (R29.898)     Time: 9798-9211 PT Time Calculation (min) (ACUTE ONLY): 19 min  Charges:  $Gait Training: 8-22 mins                    G Codes:      Mabeline Caras, PT, DPT Acute Rehab Services  Pager: [place] 06/19/2017, 10:29 AM

## 2017-06-19 NOTE — Care Management Note (Signed)
Case Management Note  Patient Details  Name: Stacie Bennett MRN: [id number] Date of Birth: 15-Jul-1927  Subjective/Objective:         Pt admitted with CVA. She is from home with son.           Action/Plan: Pt discharging home with [facility] services. CM provided the family choice and they selected [facility]. [facility] accepted the referral.  Pt with orders for a walker. Jermaine with [facility] notified and will deliver to the room.  Family to provide transport home.  Expected Discharge Date:  06/19/17               Expected Discharge Plan:  [place]  In-House Referral:     Discharge planning Services  CM Consult  Post Acute Care Choice:  Home Health, Durable Medical Equipment Choice offered to:  Patient, Adult Children  DME Arranged:  Walker rolling DME Agency:  [place] Arranged:  PT, OT(RN will be added after PT sees patient) [place]:  [facility] (now [facility])  Status of Service:  Completed, signed off  If discussed at [place] of Stay Meetings, dates discussed:    Additional Comments:  Pollie Friar, RN 06/19/2017, 12:00 PM

## 2017-06-24 ENCOUNTER — Other Ambulatory Visit: Payer: Self-pay

## 2017-06-24 NOTE — Patient Outreach (Signed)
[facility]) Care Management  06/24/2017  Elanora Quin Apr 26, 1927 [id number]     EMMI-STROKE RED ON EMMI ALERT Day # 3 Date: 06/22/17 Red Alert Reason: " Questions/problems with meds? Yes"   Outreach attempt # 1 to patient. As RN CM was leaving voicemail message patient's son returned call. He voiced that patient was taking a nap at present. No ROI on file for son. Son freely reported that he has a question and asked if RN CM could assist. He voiced that he got patient's meds and patient is to start taking 80mg  dose of cholesterol med. He voiced that patient having trouble swallowing med. He advised pharmacy and was told by them not to crush med and to get MD to write a script for 40mg  dosage(two tabs) instead as it would be easier for patient to swallow. Son is inquiring about how to reach/contact MD that saw patient in the hospital to get prescription. Advised son that once patient discharged that care and prescriptions needs are deferred to PCP. He stated that patient has PCP appt this afternoon. RN CM instructed son to discuss this issue with MD during appt. He voiced understanding and stated that otherwise patient was doing well.         Plan: RN CM will close case as no further interventions needed at this time.   Enzo Montgomery, RN,BSN,CCM [place] Management Telephonic Care Management Coordinator Direct Phone: [phone] Toll Free: [phone] Fax: [phone]

## 2017-06-26 ENCOUNTER — Other Ambulatory Visit: Payer: Self-pay

## 2017-06-26 NOTE — Patient Outreach (Addendum)
[facility]) Care Management  06/26/2017  Stacie Bennett Nov 16, 1927 [id number]     EMMI-Stroke RED ON EMMI ALERT Day # 6 Date: 06/25/17 Red Alert Reason: "Questions/problems with meds? Yes"   Outreach attempt # 1 to patient. A female answered and reported that patient was taking a nap at present and requested call be completed with her. No ROI on file and unable to continue call with her. Advised that RN CM would call back at another time.           Plan: RN CM will make outreach attempt to patient within one business day.    Enzo Montgomery, RN,BSN,CCM [place] Management Telephonic Care Management Coordinator Direct Phone: [phone] Toll Free: [phone] Fax: [phone]

## 2017-06-27 ENCOUNTER — Other Ambulatory Visit: Payer: Self-pay

## 2017-06-27 NOTE — Patient Outreach (Signed)
[facility]) Care Management  06/27/2017  Romonia Yanik [birth year] [id number]   EMMI-Stroke RED ON EMMI ALERT Day # 6 Date: 06/25/17 Red Alert Reason: "Questions/problems with meds? Yes"    Outreach attempt #2 to patient. No answer at present.    Plan: RN CM will make outreach attempt to patient within three business days.    Enzo Montgomery, RN,BSN,CCM [place] Management Telephonic Care Management Coordinator Direct Phone: [phone] Toll Free: [phone] Fax: [phone]

## 2017-06-30 ENCOUNTER — Other Ambulatory Visit: Payer: Self-pay

## 2017-06-30 NOTE — Patient Outreach (Signed)
[facility]) Care Management  06/30/2017  Stacie Bennett Dec 09, 1927 [id number]     EMMI-Stroke RED ON EMMI ALERT Day #6 Date:06/25/17 Red Alert Reason:"Questions/problems with meds? Yes"   Outreach attempt #3 to patient. No answer at present.    Plan: RN CM will close case at this time.   Enzo Montgomery, RN,BSN,CCM [place] Management Telephonic Care Management Coordinator Direct Phone: [phone] Toll Free: [phone] Fax: [phone]

## 2017-07-09 ENCOUNTER — Emergency Department (HOSPITAL_COMMUNITY)
Admission: EM | Admit: 2017-07-09 | Discharge: 2017-07-09 | Disposition: A | Discharge status: Home / Self Care | Payer: Medicare Other | Attending: Emergency Medicine | Admitting: Emergency Medicine

## 2017-07-09 ENCOUNTER — Other Ambulatory Visit: Payer: Self-pay

## 2017-07-09 ENCOUNTER — Emergency Department (HOSPITAL_COMMUNITY): Payer: Medicare Other

## 2017-07-09 DIAGNOSIS — Z79899 Other long term (current) drug therapy: Secondary | ICD-10-CM | POA: Diagnosis not present

## 2017-07-09 DIAGNOSIS — Z7982 Long term (current) use of aspirin: Secondary | ICD-10-CM | POA: Insufficient documentation

## 2017-07-09 DIAGNOSIS — I10 Essential (primary) hypertension: Secondary | ICD-10-CM | POA: Diagnosis not present

## 2017-07-09 DIAGNOSIS — Z7901 Long term (current) use of anticoagulants: Secondary | ICD-10-CM | POA: Diagnosis not present

## 2017-07-09 DIAGNOSIS — Z8673 Personal history of transient ischemic attack (TIA), and cerebral infarction without residual deficits: Secondary | ICD-10-CM | POA: Diagnosis not present

## 2017-07-09 DIAGNOSIS — I6350 Cerebral infarction due to unspecified occlusion or stenosis of unspecified cerebral artery: Secondary | ICD-10-CM | POA: Diagnosis present

## 2017-07-09 DIAGNOSIS — J449 Chronic obstructive pulmonary disease, unspecified: Secondary | ICD-10-CM | POA: Insufficient documentation

## 2017-07-09 DIAGNOSIS — R531 Weakness: Secondary | ICD-10-CM | POA: Diagnosis not present

## 2017-07-09 LAB — URINALYSIS, ROUTINE W REFLEX MICROSCOPIC
Bilirubin Urine: NEGATIVE
GLUCOSE, UA: NEGATIVE mg/dL
Hgb urine dipstick: NEGATIVE
KETONES UR: NEGATIVE mg/dL
NITRITE: NEGATIVE
PROTEIN: NEGATIVE mg/dL
Specific Gravity, Urine: 1.018 (ref 1.005–1.030)
pH: 6 (ref 5.0–8.0)

## 2017-07-09 LAB — CBC WITH DIFFERENTIAL/PLATELET
Abs Immature Granulocytes: 0 10*3/uL (ref 0.0–0.1)
BASOS ABS: 0 10*3/uL (ref 0.0–0.1)
BASOS PCT: 0 %
EOS ABS: 0.1 10*3/uL (ref 0.0–0.7)
EOS PCT: 2 %
HCT: 42.5 % (ref 36.0–46.0)
HEMOGLOBIN: 13.4 g/dL (ref 12.0–15.0)
Immature Granulocytes: 0 %
Lymphocytes Relative: 22 %
Lymphs Abs: 1.5 10*3/uL (ref 0.7–4.0)
MCH: 27.1 pg (ref 26.0–34.0)
MCHC: 31.5 g/dL (ref 30.0–36.0)
MCV: 85.9 fL (ref 78.0–100.0)
MONO ABS: 0.7 10*3/uL (ref 0.1–1.0)
Monocytes Relative: 10 %
Neutro Abs: 4.5 10*3/uL (ref 1.7–7.7)
Neutrophils Relative %: 66 %
PLATELETS: 203 10*3/uL (ref 150–400)
RBC: 4.95 MIL/uL (ref 3.87–5.11)
RDW: 14.3 % (ref 11.5–15.5)
WBC: 6.8 10*3/uL (ref 4.0–10.5)

## 2017-07-09 LAB — COMPREHENSIVE METABOLIC PANEL
ALK PHOS: 157 U/L — AB (ref 38–126)
ALT: 20 U/L (ref 14–54)
ANION GAP: 7 (ref 5–15)
AST: 19 U/L (ref 15–41)
Albumin: 3.4 g/dL — ABNORMAL LOW (ref 3.5–5.0)
BUN: 14 mg/dL (ref 6–20)
CALCIUM: 10.5 mg/dL — AB (ref 8.9–10.3)
CO2: 26 mmol/L (ref 22–32)
Chloride: 108 mmol/L (ref 101–111)
Creatinine, Ser: 0.84 mg/dL (ref 0.44–1.00)
GFR calc non Af Amer: >60 mL/min (ref >60)
Glucose, Bld: 112 mg/dL — ABNORMAL HIGH (ref 65–99)
POTASSIUM: 4.2 mmol/L (ref 3.5–5.1)
SODIUM: 141 mmol/L (ref 135–145)
Total Bilirubin: 1 mg/dL (ref 0.3–1.2)
Total Protein: 6.8 g/dL (ref 6.5–8.1)

## 2017-07-09 MED ORDER — NITROFURANTOIN MONOHYD MACRO 100 MG PO CAPS: 100.0000 mg | ORAL_CAPSULE | Freq: Two times a day (BID) | ORAL | 0 refills | Status: DC

## 2017-07-09 MED ORDER — NITROFURANTOIN MONOHYD MACRO 100 MG PO CAPS
100.0000 mg | ORAL_CAPSULE | Freq: Once | ORAL | Status: AC
  Administered 2017-07-09: 100 mg via ORAL
  Filled 2017-07-09: qty 1

## 2017-07-09 NOTE — Discharge Instructions (Signed)
Please return for any problem.  Follow-up with your regular doctors in 1 to 2 days as instructed.

## 2017-07-09 NOTE — ED Triage Notes (Signed)
Patient feels like she is favoring her left side and has an altered gate since yesterday. Patient states no pain at this time.

## 2017-07-09 NOTE — ED Provider Notes (Signed)
[facility] EMERGENCY DEPARTMENT Provider Note   CSN: [id number] Arrival date & time: 07/09/17  1415     History   Chief Complaint Chief Complaint  Patient presents with  . Cerebrovascular Accident    HPI Stacie Bennett is a 84 y.o. female.  84 year old female with prior history significant for recent CVA presents for evaluation of right-sided weakness.  Patient recently was discharged from [facility] on 5/23 after admission and work-up for acute/subacute infarct in the left posterior and occipital lobes resulting in bilateral vision loss.  The patient's infarct was identified by MRI on 5/21 - the patient required anesthesia to complete the MRI.  Additional prior medical history includes COPD, cataracts, glaucoma, rheumatoid arthritis, dementia and hypertension.  CTA of head and neck also demonstrated significant CVD.  The family declined intervention for same given age and history of dementia.  Patient is reportedly compliant with ASA/Plavix dual therapy.   Family brings the patient to the ED today after physical therapy noted a slight decrease in strength of the right arm and right leg.  Patient was last seen by physical therapist on Monday of this week.  The therapist noted a significant change today on his exam.  Patient is without current complaint.  She denies current chest pain, shortness of breath, nausea, vomiting, focal weakness, or other acute complaint. She is pleasant and oriented to person and place.   The history is provided by the patient, medical records and a relative.  Weakness  Primary symptoms include focal weakness. This is a new problem. The current episode started yesterday. The problem has not changed since onset.There was right upper extremity and right lower extremity focality noted.    Past Medical History:  Diagnosis Date  . Acute loss of vision, bilateral 06/16/2017  . Anxiety   . Bronchitis   . Cerebral embolism with cerebral  infarction 06/18/2017  . COPD (chronic obstructive pulmonary disease) ([place])   . Glaucoma   . Hyperlipidemia LDL goal <70 06/19/2017  . Hypertension   . Peripheral vascular disease ([place])   . Rheumatoid arthritis ([place])   . Stenosis of left carotid artery   . Stroke ([place])   . Thyroid mass 06/18/2017  . Vertigo     Patient Active Problem List   Diagnosis Date Noted  . Low TSH level 06/19/2017  . Hyperlipidemia LDL goal <70 06/19/2017  . Stroke ([place]) 06/19/2017  . Hyperlipidemia   . Cerebral embolism with cerebral infarction 06/18/2017  . Thyroid mass 06/18/2017  . Stenosis of left carotid artery   . COPD (chronic obstructive pulmonary disease) ([place]) 06/16/2017  . Hypertension 06/16/2017  . Acute loss of vision, bilateral 06/16/2017    Past Surgical History:  Procedure Laterality Date  . ABDOMINAL HYSTERECTOMY    . BREAST SURGERY    . RADIOLOGY WITH ANESTHESIA N/A 06/17/2017   Procedure: RADIOLOGY WITH ANESTHESIA;  Surgeon: Radiologist, Medication, MD;  Location: [facility];  Service: Radiology;  Laterality: N/A;     OB History   None      Home Medications    Prior to Admission medications   Medication Sig Start Date End Date Taking? Authorizing Provider  acetaminophen (TYLENOL) 500 MG tablet Take 1,000 mg by mouth every 6 (six) hours as needed for headache.    [provider]  albuterol (PROVENTIL HFA;VENTOLIN HFA) 108 (90 BASE) MCG/ACT inhaler Inhale 2 puffs into the lungs every 6 (six) hours as needed for wheezing or shortness of breath. 09/25/14   Horton,  Barbette Hair, MD  amLODipine (NORVASC) 5 MG tablet Take 5 mg by mouth daily. 05/09/17   [provider]  aspirin 325 MG EC tablet Take 1 tablet (325 mg total) by mouth daily. Continue x 3 months then stop. 06/20/17   Rama, Venetia Maxon, MD  atorvastatin (LIPITOR) 80 MG tablet Take 1 tablet (80 mg total) by mouth daily at 6 PM. 06/19/17   Rama, Venetia Maxon, MD  clopidogrel (PLAVIX) 75 MG tablet Take 1 tablet (75 mg  total) by mouth daily. 06/20/17   Rama, Venetia Maxon, MD  latanoprost (XALATAN) 0.005 % ophthalmic solution Place 1 drop into both eyes at bedtime.     [provider]  meclizine (ANTIVERT) 25 MG tablet Take 25 mg by mouth every 8 (eight) hours as needed for dizziness.    [provider]  mometasone-formoterol (DULERA) 100-5 MCG/ACT AERO Inhale 2 puffs into the lungs daily.    [provider]    Family History Family History  Problem Relation Age of Onset  . Hypertension Other     Social History Social History   Tobacco Use  . Smoking status: Never Smoker  . Smokeless tobacco: Never Used  Substance Use Topics  . Alcohol use: No  . Drug use: No     Allergies   Keflex [cephalexin] and Pletal [cilostazol]   Review of Systems Review of Systems  Neurological: Positive for focal weakness and weakness.  All other systems reviewed and are negative.    Physical Exam Updated Vital Signs BP (!) 189/86 (BP Location: Right Arm)   Pulse 78   Temp 98.1 F (36.7 C) (Oral)   Resp (!) 23   Ht 5' (1.524 m)   Wt 66.7 kg (147 lb)   SpO2 100%   BMI 28.71 kg/m   Physical Exam  Constitutional: She appears well-developed and well-nourished. No distress.  HENT:  Head: Normocephalic and atraumatic.  Mouth/Throat: Oropharynx is clear and moist.  Eyes: Pupils are equal, round, and reactive to light. Conjunctivae and EOM are normal.  Neck: Normal range of motion. Neck supple.  Cardiovascular: Normal rate, regular rhythm and normal heart sounds.  Pulmonary/Chest: Effort normal and breath sounds normal. No respiratory distress.  Abdominal: Soft. She exhibits no distension. There is no tenderness.  Musculoskeletal: Normal range of motion. She exhibits no edema or deformity.  Neurological: She is alert.  AO to person, place  Normal speech No Facial droop 5/5 strength to all 4 Extremities   Skin: Skin is warm and dry.  Psychiatric: She has a normal mood and  affect.  Nursing note and vitals reviewed.    ED Treatments / Results  Labs (all labs ordered are listed, but only abnormal results are displayed) Labs Reviewed  URINALYSIS, ROUTINE W REFLEX MICROSCOPIC - Abnormal; Notable for the following components:      Result Value   APPearance HAZY (*)    Leukocytes, UA MODERATE (*)    Bacteria, UA FEW (*)    All other components within normal limits  COMPREHENSIVE METABOLIC PANEL - Abnormal; Notable for the following components:   Glucose, Bld 112 (*)    Calcium 10.5 (*)    Albumin 3.4 (*)    Alkaline Phosphatase 157 (*)    All other components within normal limits  CBC WITH DIFFERENTIAL/PLATELET    EKG EKG Interpretation  Date/Time:  Wednesday July 09 2017 14:34:47 EDT Ventricular Rate:  65 PR Interval:    QRS Duration: 72 QT Interval:  388 QTC Calculation:  404 R Axis:   39 Text Interpretation:  Sinus rhythm Probable left atrial enlargement Anterior infarct, old Baseline wander in lead(s) V1 Confirmed by Dene Gentry [phone]) on 07/09/2017 3:12:00 PM Also confirmed by Dene Gentry [phone]), editor Shon Hale [phone])  on 07/09/2017 3:19:15 PM   Radiology Ct Head Wo Contrast  Result Date: 07/09/2017 CLINICAL DATA:  Altered gait since yesterday. Favoring the left side. EXAM: CT HEAD WITHOUT CONTRAST TECHNIQUE: Contiguous axial images were obtained from the base of the skull through the vertex without intravenous contrast. COMPARISON:  06/18/2017 head CT/CTA and 06/17/2017 MRI FINDINGS: Brain: Evolving subacute left parieto-occipital infarcts are unchanged in distribution from the prior studies. There is a chronic right occipital infarct. A chronic lacunar infarct is noted at the posterior aspect of the left external capsule. No definite acute infarct, intracranial hemorrhage, mass, midline shift, or extra-axial fluid collection is identified. Scattered subcortical and periventricular white matter hypodensities exclusive of the  aforementioned chronic infarcts are nonspecific but compatible with mild for age chronic small vessel ischemic disease, similar to the prior CT. Mild global cerebral atrophy is not greater than expected for age. Vascular: Calcified atherosclerosis at the skull base. No hyperdense vessel. Skull: No fracture or focal osseous lesion. Sinuses/Orbits: Visualized paranasal sinuses and mastoid air cells are clear. Orbits are unremarkable. Other: None. IMPRESSION: 1. No evidence of acute intracranial abnormality. 2. Evolving subacute left parieto-occipital infarcts. 3. Chronic ischemia as above. Electronically Signed   By: Logan Bores M.D.   On: 07/09/2017 17:32    Procedures Procedures (including critical care time)  Medications Ordered in ED Medications - No data to display   Initial Impression / Assessment and Plan / ED Course  I have reviewed the triage vital signs and the nursing notes.  Pertinent labs & imaging results that were available during my care of the patient were reviewed by me and considered in my medical decision making (see chart for details).     MDM  Screen complete  Patient is presenting for reported weakness primarily on the right arm and leg.  On exam this weakness is not reproduced.  CT imaging does not reveal any acute CNS process.  Baseline screening labs reveal the possibility of an early UTI.  Case was discussed with Dr. Malen Gauze, neuro hospitalist, with reference to recent admission and diagnosis with CVA.  Patient is currently on aspirin and Plavix.  Family does not desire more aggressive intervention for the patient's known CVD.  Dr. Malen Gauze agrees that there is no further indication for additional treatment in the hospital.  After ED evaluation and treatment, the patient's family desires discharge home.  They declined further intervention or admission for observation.  Close follow-up instructions given and understood.  Strict return precautions given and  understood.    Final Clinical Impressions(s) / ED Diagnoses   Final diagnoses:  Weakness    ED Discharge Orders        Ordered    nitrofurantoin, macrocrystal-monohydrate, (MACROBID) 100 MG capsule  2 times daily     07/09/17 1905       Valarie Merino, MD 07/09/17 1909

## 2017-07-09 NOTE — ED Notes (Signed)
Pt and family verbalized understanding of antibiotic use and no further questions, VSS, NAD. Pt d/c home with family driving.

## 2017-07-23 ENCOUNTER — Encounter: Payer: Self-pay | Admitting: Adult Health

## 2017-07-23 ENCOUNTER — Ambulatory Visit (INDEPENDENT_AMBULATORY_CARE_PROVIDER_SITE_OTHER): Payer: Medicare Other | Admitting: Adult Health

## 2017-07-23 VITALS — BP 148/79 | HR 69 | Wt 150.8 lb

## 2017-07-23 DIAGNOSIS — I63512 Cerebral infarction due to unspecified occlusion or stenosis of left middle cerebral artery: Secondary | ICD-10-CM | POA: Diagnosis not present

## 2017-07-23 DIAGNOSIS — E785 Hyperlipidemia, unspecified: Secondary | ICD-10-CM

## 2017-07-23 DIAGNOSIS — I10 Essential (primary) hypertension: Secondary | ICD-10-CM

## 2017-07-23 MED ORDER — ATORVASTATIN CALCIUM 80 MG PO TABS: 80.0000 mg | ORAL_TABLET | Freq: Every day | ORAL | 3 refills | Status: DC

## 2017-07-23 MED ORDER — ASPIRIN 325 MG PO TBEC: 325.0000 mg | DELAYED_RELEASE_TABLET | Freq: Every day | ORAL | 0 refills | Status: DC

## 2017-07-23 NOTE — Progress Notes (Addendum)
[facility] [address] [place]. [place] [address] [phone]       OFFICE FOLLOW UP NOTE  Ms. Stacie Bennett Date of Birth:  1927/04/23 Medical Record Number:  [id number]   Reason for Referral:  hospital stroke follow up  CHIEF COMPLAINT:  Chief Complaint  Patient presents with  . New Patient (Initial Visit)    Patient here with son and daughter in law. Hospital follow up. Patient's son reports that the stroke has made her vision worse. She has good days and bad days.      HPI: Stacie Bennett is being seen today for initial visit in the office for left MCA/PCA infarcts on 06/16/17. History obtained from patient and chart review. Reviewed all radiology images and labs personally.  Stacie Bennett is a 84 year old female with history of COPD, hypertension, HLD, PVD, RA, dementia, cataract and glaucoma admitted for headache and decreased vision.  CT head reviewed and showed bilateral chronic occipital infarcts, and acute/subacute left large occipital infarct.  MRI head reviewed and confirmed acute large left inferior MCA/PCA infarct, but also left MCA punctate infarcts.  MRA head showed multiple segment of severe stenosis of basilar artery and bilateral PCAs.  CTA head and neck showed critical stenosis left ICA proximally.  Right ICA proximal atherosclerosis, less than 50% stenosis.  Bilateral VA origin severe stenosis.  ESR/CRP negative.    2D echo showed an EF of 65-0%. A1c 5.6, LDL 162. Stroke consistent with left ICA high-grade stenosis. It was discussed with son and patient with Dr. Erlinda Hong during admission about further revascularization procedure of left ICA versus left carotid stenting.  However, patient and family refused further surgical intervention.  It was decided on continued medical management.  Recommended aspirin 325 and Plavix for 3 months and then Plavix alone. Recommended Lipitor 80 for stroke prevention.  BP goal 130-150 given critical stenosis of left ICA. PT/OT  recommend home PT/OT.  It was also recommended for patient and family to check with low vision service/program for further help. Discharged home in stable condition.   Patient is being seen today for hospital follow-up and is accompanied by her son and daughter.  States she continues to have visual loss but this has been slightly improving.  Patient also does have a history of glaucoma and cataracts.  They have not followed up with ophthalmology since hospital discharge.  Daughter and son are both questioning what exercises patient can do to help speed up visual loss.  She states she is able to see outlines and colors but this does wax and wane depending on fatigue level.  Also has complaints of slight increase in memory loss but this is also been improving.  She has completed both home PT/OT.  Continues to take both aspirin 81 mg (recommended aspirin 325 at discharge) and Plavix without side effects of bleeding or bruising.  Continues to take Lipitor without side effects myalgias.  Blood pressure today satisfactory at 140/79.  It was explained to daughter and son that SBP parameters 130-150 due to stenosis.  Denies new or worsening stroke/TIA symptoms.  ROS:   14 system review of systems performed and negative with exception of blurred vision, double vision, loss of vision, shortness of breath, and confusion  PMH:  Past Medical History:  Diagnosis Date  . Acute loss of vision, bilateral 06/16/2017  . Anxiety   . Bronchitis   . Cerebral embolism with cerebral infarction 06/18/2017  . COPD (chronic obstructive pulmonary disease) ([place])   . Glaucoma   .  Hyperlipidemia LDL goal <70 06/19/2017  . Hypertension   . Peripheral vascular disease ([place])   . Rheumatoid arthritis ([place])   . Stenosis of left carotid artery   . Stroke ([place])   . Thyroid mass 06/18/2017  . Vertigo     PSH:  Past Surgical History:  Procedure Laterality Date  . ABDOMINAL HYSTERECTOMY    . BREAST SURGERY    . RADIOLOGY WITH  ANESTHESIA N/A 06/17/2017   Procedure: RADIOLOGY WITH ANESTHESIA;  Surgeon: Radiologist, Medication, MD;  Location: [facility];  Service: Radiology;  Laterality: N/A;    Social History:  Social History   Socioeconomic History  . Marital status: Single    Spouse name: Not on file  . Number of children: Not on file  . Years of education: Not on file  . Highest education level: Not on file  Occupational History  . Not on file  Social Needs  . Financial resource strain: Not on file  . Food insecurity:    Worry: Not on file    Inability: Not on file  . Transportation needs:    Medical: Not on file    Non-medical: Not on file  Tobacco Use  . Smoking status: Never Smoker  . Smokeless tobacco: Never Used  Substance and Sexual Activity  . Alcohol use: No  . Drug use: No  . Sexual activity: Not on file  Lifestyle  . Physical activity:    Days per week: Not on file    Minutes per session: Not on file  . Stress: Not on file  Relationships  . Social connections:    Talks on phone: Not on file    Gets together: Not on file    Attends religious service: Not on file    Active member of club or organization: Not on file    Attends meetings of clubs or organizations: Not on file    Relationship status: Not on file  . Intimate partner violence:    Fear of current or ex partner: Not on file    Emotionally abused: Not on file    Physically abused: Not on file    Forced sexual activity: Not on file  Other Topics Concern  . Not on file  Social History Narrative  . Not on file    Family History:  Family History  Problem Relation Age of Onset  . Hypertension Other     Medications:   Current Outpatient Medications on File Prior to Visit  Medication Sig Dispense Refill  . albuterol (PROVENTIL HFA;VENTOLIN HFA) 108 (90 BASE) MCG/ACT inhaler Inhale 2 puffs into the lungs every 6 (six) hours as needed for wheezing or shortness of breath. 1 Inhaler 0  . amLODipine (NORVASC) 5 MG tablet  Take 5 mg by mouth daily.  4  . clopidogrel (PLAVIX) 75 MG tablet Take 1 tablet (75 mg total) by mouth daily. 30 tablet 2  . mometasone-formoterol (DULERA) 100-5 MCG/ACT AERO Inhale 2 puffs into the lungs daily.    . nitrofurantoin, macrocrystal-monohydrate, (MACROBID) 100 MG capsule Take 1 capsule (100 mg total) by mouth 2 (two) times daily. 10 capsule 0   No current facility-administered medications on file prior to visit.     Allergies:   Allergies  Allergen Reactions  . Keflex [Cephalexin] Other (See Comments)    Unknown reaction  . Pletal [Cilostazol] Other (See Comments)    Unknown reaction     Physical Exam  Vitals:   07/23/17 0920  BP: (!) 148/79  Pulse:  69  Weight: 150 lb 12.8 oz (68.4 kg)   Body mass index is 29.45 kg/m. No exam data present  General: well developed, well nourished, pleasant elderly African-American female, seated, in no evident distress Head: head normocephalic and atraumatic.   Neck: supple with no carotid or supraclavicular bruits Cardiovascular: regular rate and rhythm, no murmurs Musculoskeletal: no deformity Skin:  no rash/petichiae Vascular:  Normal pulses all extremities  Neurologic Exam Mental Status: Awake and fully alert. Oriented to place and time. Recent and remote memory intact. Attention span, concentration and fund of knowledge appropriate. Mood and affect appropriate.  Cranial Nerves: Unable to perform funduscopic exam due to cloudiness in bilateral eyes.  Pupils equal, briskly reactive to light. Extraocular movements full without nystagmus.  Unable to perform visual field test due to decrease in vision.  Hearing intact. Facial sensation intact. Face, tongue, palate moves normally and symmetrically.  Motor: Normal bulk and tone. Normal strength in all tested extremity muscles. Sensory.: intact to touch , pinprick , position and vibratory sensation.  Coordination: Rapid alternating movements normal in all extremities.  Finger-to-nose and heel-to-shin performed accurately bilaterally. Gait and Station: Arises from chair without difficulty. Stance is normal. Gait demonstrates normal stride length and balance but does use cane for intermittent balance issues.  difficulty.  Reflexes: 1+ and symmetric. Toes downgoing.    NIHSS  0 Modified Rankin  2   Diagnostic Data (Labs, Imaging, Testing)  Ct Angio Head W Or Wo Contrast  06/18/2017 IMPRESSION:  CT HEAD: 1. Evolving acute LEFT parieto-occipital lobe infarcts without hemorrhagic conversion.  2. Old RIGHT occipital lobe/PCA territory infarct. Old LEFT basal ganglia infarct.  CTA NECK:  1. Critical stenosis LEFT ICA origin. Less than 50% stenosis RIGHT ICA.  2. Severe stenosis bilateral vertebral artery origins.  3. *An incidental finding of potential clinical significance has been found. 3.6 cm RIGHT thyroid nodule. LEFT thyroid mass, lymphadenopathy or parathyroid adenoma. Recommend thyroid ultrasound on NONEMERGENT basis. This follows ACR consensus guidelines: Managing Incidental Thyroid Nodules Detected on Imaging: White Paper of the ACR Incidental Thyroid Findings Committee. J Am Coll Radiol 2015; 12:143-150 CTA HEAD:  1. Occluded bilateral V4 segments with severe stenosis and transient occlusion basilar artery. Severe stenoses bilateral PCA.  2. Attenuated RIGHT MCA mid to distal segments. Moderate stenosis LEFT M1 segment.  3. Moderate stenosis bilateral ICA. RIGHT cavernous ICA blister aneurysm.   Ct Head Wo Contrast  06/16/2017 IMPRESSION:  No acute intracranial abnormality. 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   Mr Brain Wo Contrast/MRA head  06/17/2017 IMPRESSION:  1. Acute/early subacute infarct involving the left posterior parietal and occipital lobes. No associated hemorrhage or mass effect. Few additional punctate foci are present within left frontal and temporal lobes.  2. Chronic infarct of right occipital lobe.  3. Moderate chronic microvascular  ischemic changes and parenchymal volume loss of the brain.  4. Patent anterior and posterior intracranial circulation. 5. 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 Multiple segments of mild stenosis of the internal carotid arteries. These results will be called to the ordering clinician or representative by the Radiologist Assistant, and communication documented in the PACS or zVision Dashboard.    ASSESSMENT: Stacie Bennett is a 84 y.o. year old female here with acute/subacute left posterior and occipital lobe infarct on 06/17/17 secondary to critical high-grade left ICA stenosis. Vascular risk factors include HTN, HLD, and PVD.    PLAN: -Continue aspirin 325 mg daily and clopidogrel 75 mg daily  and Lipitor for secondary stroke prevention -Recommended to stop aspirin 81 mg and increase his dose to aspirin 325 mg for additional 2 months and then continue Plavix alone as this will complete 3 months DAPT -F/u with PCP regarding your cholesterol blood pressure management -continue to monitor BP at home -Advised daughter and son that he will be looked into to see what mind exercises patient can perform with visual loss -ADDENDUM: placed order for continued home OT for continued visual loss and be able to learn strategies to compensate for loss -Maintain strict control of hypertension with blood pressure goal below 130/90, diabetes with hemoglobin A1c goal below 6.5% and cholesterol with LDL cholesterol (bad cholesterol) goal below 70 mg/dL. I also advised the patient to eat a healthy diet with plenty of whole grains, cereals, fruits and vegetables, exercise regularly and maintain ideal body weight.  Follow up in 3 months or call earlier if needed   Greater than 50% of time during this 25 minute visit was spent on counseling,explanation of diagnosis of acute/subacute left posterior and occipital lobe infarct due to high-grade stenosis,  reviewing risk factor management of HLD and HTN, planning of further management, discussion with patient and family and coordination of care   Venancio Poisson, [facility]  [facility] [address] [place], [place] [address]  Phone [phone] Fax [phone]

## 2017-07-23 NOTE — Addendum Note (Signed)
Addended by: Venancio Poisson on: 07/23/2017 03:41 PM   Modules accepted: Orders

## 2017-07-23 NOTE — Patient Instructions (Addendum)
Continue aspirin 325 mg daily and clopidogrel 75 mg daily  and lipitor  for secondary stroke prevention  Stop aspirin after 09/16/17 as you have completed 3 months of dual antiplatelet therapy  Continue to follow up with PCP regarding cholesterol and bloodpressure management   I will look into possible need for OT or mind/visual exercises  Continue to monitor blood pressure at home  Maintain strict control of hypertension with blood pressure goal between 130 - 150, diabetes with hemoglobin A1c goal below 6.5% and cholesterol with LDL cholesterol (bad cholesterol) goal below 70 mg/dL. I also advised the patient to eat a healthy diet with plenty of whole grains, cereals, fruits and vegetables, exercise regularly and maintain ideal body weight.  Followup in the future with me in 3 months or call earlier if needed         Thank you for coming to see [place] at [facility]. I hope we have been able to provide you high quality care today.  You may receive a patient satisfaction survey over the next few weeks. We would appreciate your feedback and comments so that we may continue to improve ourselves and the health of our patients.

## 2017-07-24 NOTE — Progress Notes (Signed)
I agree with the above plan 

## 2017-07-25 ENCOUNTER — Telehealth: Payer: Self-pay | Admitting: Adult Health

## 2017-07-25 NOTE — Telephone Encounter (Signed)
Hi Stacie Bennett.   So sorry for the multiple messages.   Stacie Bennett is receiving home health care from [facility].   [facility] will not be able to provide care at the same time.   Thank you!  Stacie Bennett      Order sent to [facility] [phone] - fax [phone] . Spoke to daughter she is aware also.

## 2017-10-31 ENCOUNTER — Ambulatory Visit: Payer: Medicare Other | Admitting: Adult Health

## 2017-10-31 ENCOUNTER — Encounter: Payer: Self-pay | Admitting: Adult Health

## 2017-10-31 VITALS — BP 166/74 | HR 65 | Ht 60.0 in | Wt 151.2 lb

## 2017-10-31 DIAGNOSIS — I10 Essential (primary) hypertension: Secondary | ICD-10-CM

## 2017-10-31 DIAGNOSIS — E785 Hyperlipidemia, unspecified: Secondary | ICD-10-CM | POA: Diagnosis not present

## 2017-10-31 DIAGNOSIS — I63512 Cerebral infarction due to unspecified occlusion or stenosis of left middle cerebral artery: Secondary | ICD-10-CM

## 2017-10-31 NOTE — Patient Instructions (Addendum)
Continue clopidogrel 75 mg daily  and lipitor 80mg   for secondary stroke prevention  Stop aspirin at this time and continue plavix only  We will check cholesterol levels and will call you with results  Continue to follow up with PCP regarding cholesterol and blood pressure management   Continue to stay active and maintain a healthy diet  Schedule appointment with eye doctor for continued vision loss   Continue to monitor blood pressure at home  Maintain strict control of hypertension with blood pressure goal between 130-150, diabetes with hemoglobin A1c goal below 6.5% and cholesterol with LDL cholesterol (bad cholesterol) goal below 70 mg/dL. I also advised the patient to eat a healthy diet with plenty of whole grains, cereals, fruits and vegetables, exercise regularly and maintain ideal body weight.  Followup in the future with me in 6 months or call earlier if needed       Thank you for coming to see [place] at [facility]. I hope we have been able to provide you high quality care today.  You may receive a patient satisfaction survey over the next few weeks. We would appreciate your feedback and comments so that we may continue to improve ourselves and the health of our patients.

## 2017-10-31 NOTE — Progress Notes (Signed)
I agree with the above plan 

## 2017-10-31 NOTE — Progress Notes (Signed)
[facility] [address] [place]. [place] [address] [phone]       OFFICE FOLLOW UP NOTE  Ms. Breasia Karges Date of Birth:  Mar 02, 1927 Medical Record Number:  [id number]   Reason for visit: stroke follow up  CHIEF COMPLAINT:  Chief Complaint  Patient presents with  . Follow-up    Patient's son reports that she is doing about the same since last visit.     HPI: Fantasia Jinkins is being seen today in the office for left MCA/PCA infarcts on 06/16/17. History obtained from patient and chart review. Reviewed all radiology images and labs personally.  Kaydin Labo is a 84 year old female with history of COPD, hypertension, HLD, PVD, RA, dementia, cataract and glaucoma admitted for headache and decreased vision.  CT head reviewed and showed bilateral chronic occipital infarcts, and acute/subacute left large occipital infarct.  MRI head reviewed and confirmed acute large left inferior MCA/PCA infarct, but also left MCA punctate infarcts.  MRA head showed multiple segment of severe stenosis of basilar artery and bilateral PCAs.  CTA head and neck showed critical stenosis left ICA proximally.  Right ICA proximal atherosclerosis, less than 50% stenosis.  Bilateral VA origin severe stenosis.  ESR/CRP negative.    2D echo showed an EF of 65-0%. A1c 5.6, LDL 162. Stroke consistent with left ICA high-grade stenosis. It was discussed with son and patient with Dr. Erlinda Hong during admission about further revascularization procedure of left ICA versus left carotid stenting.  However, patient and family refused further surgical intervention.  It was decided on continued medical management.  Recommended aspirin 325 and Plavix for 3 months and then Plavix alone. Recommended Lipitor 80 for stroke prevention.  BP goal 130-150 given critical stenosis of left ICA. PT/OT recommend home PT/OT.  It was also recommended for patient and family to check with low vision service/program for further help.  Discharged home in stable condition.   07/23/2017 visit: Patient is being seen today for hospital follow-up and is accompanied by her son and daughter.  States she continues to have visual loss but this has been slightly improving.  Patient also does have a history of glaucoma and cataracts.  They have not followed up with ophthalmology since hospital discharge.  Daughter and son are both questioning what exercises patient can do to help speed up visual loss.  She states she is able to see outlines and colors but this does wax and wane depending on fatigue level.  Also has complaints of slight increase in memory loss but this is also been improving.  She has completed both home PT/OT.  Continues to take both aspirin 81 mg (recommended aspirin 325 at discharge) and Plavix without side effects of bleeding or bruising.  Continues to take Lipitor without side effects myalgias.  Blood pressure today satisfactory at 140/79.  It was explained to daughter and son that SBP parameters 130-150 due to stenosis.  Denies new or worsening stroke/TIA symptoms.  Interval history 10/31/2017: Patient returns today for scheduled follow-up visit and is accompanied by her son and daughter-in-law.  She states overall she is been doing well with some improvement in her vision as she is able to see fine print better but does continue to have some difficulty with distance greater than 2 feet out.  Her memory has been stable and does wax and wane at times.  She continues to live with son and daughter-in-law where she needs minimal assistance with dressing and bathing but for the most part independent.  Continues to take Plavix and aspirin 325 mg as it was recommended by her PCP, per son, to continue both for secondary stroke prevention.  As patient has been on DAPT for the greater than 3 months, it is recommended to stop aspirin and continue Plavix only as DAPT for greater than 3 months does not decrease risk of stroke but increases risk of  hemorrhage.  Continues to take Lipitor 80 mg without side effects of myalgias.  Has not had recent lipid panel obtained by PCP.  Blood pressure today elevated at 166/74 but as they do check this at home typical SBP 1 25-1 35.  She does use a cane for ambulation with long distances and denies any recent falls.  Denies new or worsening stroke/TIA symptoms.   ROS:   14 system review of systems performed and negative with exception of cough and memory loss  PMH:  Past Medical History:  Diagnosis Date  . Acute loss of vision, bilateral 06/16/2017  . Anxiety   . Bronchitis   . Cerebral embolism with cerebral infarction 06/18/2017  . COPD (chronic obstructive pulmonary disease) ([place])   . Glaucoma   . Hyperlipidemia LDL goal <70 06/19/2017  . Hypertension   . Peripheral vascular disease ([place])   . Rheumatoid arthritis ([place])   . Stenosis of left carotid artery   . Stroke ([place])   . Thyroid mass 06/18/2017  . Vertigo     PSH:  Past Surgical History:  Procedure Laterality Date  . ABDOMINAL HYSTERECTOMY    . BREAST SURGERY    . RADIOLOGY WITH ANESTHESIA N/A 06/17/2017   Procedure: RADIOLOGY WITH ANESTHESIA;  Surgeon: Radiologist, Medication, MD;  Location: [facility];  Service: Radiology;  Laterality: N/A;    Social History:  Social History   Socioeconomic History  . Marital status: Single    Spouse name: Not on file  . Number of children: Not on file  . Years of education: Not on file  . Highest education level: Not on file  Occupational History  . Not on file  Social Needs  . Financial resource strain: Not on file  . Food insecurity:    Worry: Not on file    Inability: Not on file  . Transportation needs:    Medical: Not on file    Non-medical: Not on file  Tobacco Use  . Smoking status: Never Smoker  . Smokeless tobacco: Never Used  Substance and Sexual Activity  . Alcohol use: No  . Drug use: No  . Sexual activity: Not on file  Lifestyle  . Physical activity:    Days per  week: Not on file    Minutes per session: Not on file  . Stress: Not on file  Relationships  . Social connections:    Talks on phone: Not on file    Gets together: Not on file    Attends religious service: Not on file    Active member of club or organization: Not on file    Attends meetings of clubs or organizations: Not on file    Relationship status: Not on file  . Intimate partner violence:    Fear of current or ex partner: Not on file    Emotionally abused: Not on file    Physically abused: Not on file    Forced sexual activity: Not on file  Other Topics Concern  . Not on file  Social History Narrative  . Not on file    Family History:  Family History  Problem Relation Age of Onset  . Hypertension Other     Medications:   Current Outpatient Medications on File Prior to Visit  Medication Sig Dispense Refill  . albuterol (PROVENTIL HFA;VENTOLIN HFA) 108 (90 BASE) MCG/ACT inhaler Inhale 2 puffs into the lungs every 6 (six) hours as needed for wheezing or shortness of breath. 1 Inhaler 0  . amLODipine (NORVASC) 5 MG tablet Take 5 mg by mouth daily.  4  . aspirin 325 MG EC tablet Take 1 tablet (325 mg total) by mouth daily. Continue x 3 months then stop. 60 tablet 0  . atorvastatin (LIPITOR) 80 MG tablet Take 1 tablet (80 mg total) by mouth daily at 6 PM. 90 tablet 3  . busPIRone (BUSPAR) 5 MG tablet Take 1 tablet by mouth daily.  2  . cetirizine (ZYRTEC) 10 MG tablet Take 1 tablet by mouth daily.  2  . clopidogrel (PLAVIX) 75 MG tablet Take 1 tablet (75 mg total) by mouth daily. 30 tablet 2  . mometasone-formoterol (DULERA) 100-5 MCG/ACT AERO Inhale 2 puffs into the lungs daily.     No current facility-administered medications on file prior to visit.     Allergies:   Allergies  Allergen Reactions  . Keflex [Cephalexin] Other (See Comments)    Unknown reaction  . Pletal [Cilostazol] Other (See Comments)    Unknown reaction     Physical Exam  Vitals:   10/31/17  1130  BP: (!) 166/74  Pulse: 65  Weight: 151 lb 3.2 oz (68.6 kg)  Height: 5' (1.524 m)   Body mass index is 29.53 kg/m. No exam data present  General: well developed, well nourished, pleasant elderly African-American female, seated, in no evident distress Head: head normocephalic and atraumatic.   Neck: supple with no carotid or supraclavicular bruits Cardiovascular: regular rate and rhythm, no murmurs Musculoskeletal: no deformity Skin:  no rash/petichiae Vascular:  Normal pulses all extremities  Neurologic Exam Mental Status: Awake and fully alert. Oriented to place and time. Recent and remote memory intact. Attention span, concentration and fund of knowledge appropriate. Mood and affect appropriate.  Cranial Nerves: Unable to perform funduscopic exam due to cloudiness in bilateral eyes.  Pupils equal, briskly reactive to light. Extraocular movements full without nystagmus.  Unable to perform visual field test due to decrease in vision.  Hearing intact. Facial sensation intact. Face, tongue, palate moves normally and symmetrically.  Motor: Normal bulk and tone. Normal strength in all tested extremity muscles. Sensory.: intact to touch , pinprick , position and vibratory sensation.  Coordination: Rapid alternating movements normal in all extremities. Finger-to-nose and heel-to-shin performed accurately bilaterally. Gait and Station: Arises from chair without difficulty. Stance is normal. Gait demonstrates normal stride length and balance but does use cane for intermittent balance issues.  difficulty.  Reflexes: 1+ and symmetric. Toes downgoing.    NIHSS  0 Modified Rankin  2   Diagnostic Data (Labs, Imaging, Testing)  Ct Angio Head W Or Wo Contrast  06/18/2017 IMPRESSION:  CT HEAD: 1. Evolving acute LEFT parieto-occipital lobe infarcts without hemorrhagic conversion.  2. Old RIGHT occipital lobe/PCA territory infarct. Old LEFT basal ganglia infarct.  CTA NECK:  1. Critical  stenosis LEFT ICA origin. Less than 50% stenosis RIGHT ICA.  2. Severe stenosis bilateral vertebral artery origins.  3. *An incidental finding of potential clinical significance has been found. 3.6 cm RIGHT thyroid nodule. LEFT thyroid mass, lymphadenopathy or parathyroid adenoma. Recommend thyroid ultrasound on NONEMERGENT basis. This follows ACR consensus guidelines: Managing  Incidental Thyroid Nodules Detected on Imaging: White Paper of the ACR Incidental Thyroid Findings Committee. J Am Coll Radiol 2015; 12:143-150 CTA HEAD:  1. Occluded bilateral V4 segments with severe stenosis and transient occlusion basilar artery. Severe stenoses bilateral PCA.  2. Attenuated RIGHT MCA mid to distal segments. Moderate stenosis LEFT M1 segment.  3. Moderate stenosis bilateral ICA. RIGHT cavernous ICA blister aneurysm.   Ct Head Wo Contrast  06/16/2017 IMPRESSION:  No acute intracranial abnormality. 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   Mr Brain Wo Contrast/MRA head  06/17/2017 IMPRESSION:  1. Acute/early subacute infarct involving the left posterior parietal and occipital lobes. No associated hemorrhage or mass effect. Few additional punctate foci are present within left frontal and temporal lobes.  2. Chronic infarct of right occipital lobe.  3. Moderate chronic microvascular ischemic changes and parenchymal volume loss of the brain.  4. Patent anterior and posterior intracranial circulation. 5. 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 Multiple segments of mild stenosis of the internal carotid arteries. These results will be called to the ordering clinician or representative by the Radiologist Assistant, and communication documented in the PACS or zVision Dashboard.    ASSESSMENT: Vaniya Augspurger is a 84 y.o. year old female here with acute/subacute left posterior and occipital lobe infarct on 06/17/17 secondary to critical high-grade left ICA  stenosis. Vascular risk factors include HTN, HLD, and PVD.  Patient is being seen today for follow-up and does continue to have blurred vision with some decreased memory but overall has been stable without any worsening or new symptoms.   PLAN: -Continue clopidogrel 75 mg daily  and Lipitor for secondary stroke prevention -Discontinue aspirin 325 mg at this time as greater than 5-monthDAPT completed  -F/u with PCP regarding your cholesterol and blood pressure management -Will obtain lipid panel at today's appointment to ensure satisfactory LDL and if remains elevated, we will consider adding additional medication at that time -Recommend to schedule appoint with ophthalmologist due to continued visual deficits -continue to monitor BP at home with SBP range 1 30-1 50 due to intracranial stenosis -Son and daughter-in-law provided with handicap paperwork for patient -Maintain strict control of hypertension with blood pressure goal between 1 30-1 50, diabetes with hemoglobin A1c goal below 6.5% and cholesterol with LDL cholesterol (bad cholesterol) goal below 70 mg/dL. I also advised the patient to eat a healthy diet with plenty of whole grains, cereals, fruits and vegetables, exercise regularly and maintain ideal body weight.  Follow up in 6 months or call earlier if needed   Greater than 50% of time during this 25 minute visit was spent on counseling,explanation of diagnosis of acute/subacute left posterior and occipital lobe infarct due to high-grade stenosis, reviewing risk factor management of HLD and HTN, planning of further management, discussion with patient and family and coordination of care   JVenancio Poisson AAlvarado [facility] [address] StreetSHarlem HeightsGRockbridge [place] [address] Phone [phone] [phone]

## 2017-11-01 LAB — LIPID PANEL
CHOLESTEROL TOTAL: 176 mg/dL (ref 100–199)
Chol/HDL Ratio: 3.1 ratio (ref 0.0–4.4)
HDL: 57 mg/dL (ref >39)
LDL Calculated: 101 mg/dL — ABNORMAL HIGH (ref 0–99)
TRIGLYCERIDES: 89 mg/dL (ref 0–149)
VLDL Cholesterol Cal: 18 mg/dL (ref 5–40)

## 2017-11-03 ENCOUNTER — Telehealth: Payer: Self-pay

## 2017-11-03 NOTE — Telephone Encounter (Signed)
Left vm for patients son Rajab,Ihsan on dpr about lab work [employer] NP recommendations. ------

## 2017-11-03 NOTE — Telephone Encounter (Signed)
-----   Message from Venancio Poisson, NP sent at 11/03/2017  6:52 AM EDT ----- Please notify patient (or daughter) that her LDL (bad cholesterol) was elevated at 101 (our goal is less than 70) but has decreased as it was 162 4 months ago. I would recommend and request having PCP recheck in 3 months (per patient, has appointent scheduled at that time). If it continues to be greater than 70 at that time, would recommend adding Zetia 10 mg daily in additional to atorvastatin 80mg  daily for additional management. Thank you.

## 2017-11-04 NOTE — Telephone Encounter (Signed)
Rn receive call from patients son Stacie Bennett,Stacie Bennett about his mothers lab work. Rn stated Stacie Billow NP recommend Please notify patient (or daughter) that her LDL (bad cholesterol) was elevated at 101 (our goal is less than 70) but has decreased as it was 162 4 months ago. She recommend and request having PCP recheck in 3 months (per patient, has appointent scheduled at that time). If it continues to be greater than 70 at that time, would recommend adding Zetia 10 mg daily in additional to atorvastatin 80mg  daily for additional management. The son stated his mom sees her PCP before January 2020. Rn request the son write it down so the PCP can recheck patients lipid panel. The son verbalized understanding. ------

## 2017-11-04 NOTE — Telephone Encounter (Signed)
Copy fax to patients PCP.

## 2018-04-29 ENCOUNTER — Telehealth: Payer: Self-pay

## 2018-04-29 NOTE — Telephone Encounter (Signed)
If pt family calls back they can reschedule in 3 to 4 months This is due to the COVID 19..If they still want a sooner appt please schedule them for video visit or telephone if they have smart phone,computer, ipap for video virtual. Get consent to file insurance and do visit if they want visit this month.   I tried to call pts listed number and kept getting busy signal. I call three times. I left message for pts daughter law that appt for Friday will be cancel due to office closing. I also left message that pt needs to r/s in 3 to 4 months.

## 2018-05-01 ENCOUNTER — Ambulatory Visit: Payer: Medicare Other | Admitting: Adult Health

## 2018-07-06 ENCOUNTER — Encounter (HOSPITAL_COMMUNITY): Payer: Self-pay | Admitting: Emergency Medicine

## 2018-07-06 ENCOUNTER — Other Ambulatory Visit: Payer: Self-pay

## 2018-07-06 ENCOUNTER — Inpatient Hospital Stay (HOSPITAL_COMMUNITY)
Admission: EM | Admit: 2018-07-06 | Discharge: 2018-07-10 | DRG: 643 | Disposition: A | Discharge status: Hospice | Payer: Medicare Other | Attending: Internal Medicine | Admitting: Internal Medicine

## 2018-07-06 ENCOUNTER — Emergency Department (HOSPITAL_COMMUNITY): Payer: Medicare Other

## 2018-07-06 ENCOUNTER — Inpatient Hospital Stay (HOSPITAL_COMMUNITY): Payer: Medicare Other

## 2018-07-06 DIAGNOSIS — M069 Rheumatoid arthritis, unspecified: Secondary | ICD-10-CM | POA: Diagnosis present

## 2018-07-06 DIAGNOSIS — I739 Peripheral vascular disease, unspecified: Secondary | ICD-10-CM | POA: Diagnosis present

## 2018-07-06 DIAGNOSIS — Z7982 Long term (current) use of aspirin: Secondary | ICD-10-CM

## 2018-07-06 DIAGNOSIS — N183 Chronic kidney disease, stage 3 unspecified: Secondary | ICD-10-CM | POA: Diagnosis present

## 2018-07-06 DIAGNOSIS — Z8249 Family history of ischemic heart disease and other diseases of the circulatory system: Secondary | ICD-10-CM | POA: Diagnosis not present

## 2018-07-06 DIAGNOSIS — Z8673 Personal history of transient ischemic attack (TIA), and cerebral infarction without residual deficits: Secondary | ICD-10-CM | POA: Diagnosis not present

## 2018-07-06 DIAGNOSIS — E860 Dehydration: Secondary | ICD-10-CM | POA: Diagnosis not present

## 2018-07-06 DIAGNOSIS — D631 Anemia in chronic kidney disease: Secondary | ICD-10-CM | POA: Diagnosis present

## 2018-07-06 DIAGNOSIS — H409 Unspecified glaucoma: Secondary | ICD-10-CM | POA: Diagnosis present

## 2018-07-06 DIAGNOSIS — E871 Hypo-osmolality and hyponatremia: Secondary | ICD-10-CM | POA: Diagnosis not present

## 2018-07-06 DIAGNOSIS — C750 Malignant neoplasm of parathyroid gland: Secondary | ICD-10-CM | POA: Diagnosis present

## 2018-07-06 DIAGNOSIS — F329 Major depressive disorder, single episode, unspecified: Secondary | ICD-10-CM | POA: Diagnosis present

## 2018-07-06 DIAGNOSIS — Z7189 Other specified counseling: Secondary | ICD-10-CM | POA: Diagnosis not present

## 2018-07-06 DIAGNOSIS — Z888 Allergy status to other drugs, medicaments and biological substances status: Secondary | ICD-10-CM

## 2018-07-06 DIAGNOSIS — J449 Chronic obstructive pulmonary disease, unspecified: Secondary | ICD-10-CM | POA: Diagnosis not present

## 2018-07-06 DIAGNOSIS — E213 Hyperparathyroidism, unspecified: Secondary | ICD-10-CM | POA: Diagnosis not present

## 2018-07-06 DIAGNOSIS — E870 Hyperosmolality and hypernatremia: Secondary | ICD-10-CM | POA: Diagnosis present

## 2018-07-06 DIAGNOSIS — N179 Acute kidney failure, unspecified: Secondary | ICD-10-CM | POA: Diagnosis present

## 2018-07-06 DIAGNOSIS — Z7951 Long term (current) use of inhaled steroids: Secondary | ICD-10-CM | POA: Diagnosis not present

## 2018-07-06 DIAGNOSIS — Z881 Allergy status to other antibiotic agents status: Secondary | ICD-10-CM

## 2018-07-06 DIAGNOSIS — N170 Acute kidney failure with tubular necrosis: Secondary | ICD-10-CM | POA: Diagnosis not present

## 2018-07-06 DIAGNOSIS — G9341 Metabolic encephalopathy: Secondary | ICD-10-CM | POA: Diagnosis present

## 2018-07-06 DIAGNOSIS — F0390 Unspecified dementia without behavioral disturbance: Secondary | ICD-10-CM | POA: Diagnosis present

## 2018-07-06 DIAGNOSIS — W19XXXA Unspecified fall, initial encounter: Secondary | ICD-10-CM | POA: Diagnosis present

## 2018-07-06 DIAGNOSIS — J430 Unilateral pulmonary emphysema [MacLeod's syndrome]: Secondary | ICD-10-CM | POA: Diagnosis not present

## 2018-07-06 DIAGNOSIS — I63419 Cerebral infarction due to embolism of unspecified middle cerebral artery: Secondary | ICD-10-CM | POA: Diagnosis not present

## 2018-07-06 DIAGNOSIS — E785 Hyperlipidemia, unspecified: Secondary | ICD-10-CM | POA: Diagnosis present

## 2018-07-06 DIAGNOSIS — Z7902 Long term (current) use of antithrombotics/antiplatelets: Secondary | ICD-10-CM | POA: Diagnosis not present

## 2018-07-06 DIAGNOSIS — F32A Depression, unspecified: Secondary | ICD-10-CM | POA: Insufficient documentation

## 2018-07-06 DIAGNOSIS — M7989 Other specified soft tissue disorders: Secondary | ICD-10-CM | POA: Diagnosis not present

## 2018-07-06 DIAGNOSIS — E876 Hypokalemia: Secondary | ICD-10-CM | POA: Diagnosis present

## 2018-07-06 DIAGNOSIS — I10 Essential (primary) hypertension: Secondary | ICD-10-CM | POA: Diagnosis not present

## 2018-07-06 DIAGNOSIS — Z66 Do not resuscitate: Secondary | ICD-10-CM | POA: Diagnosis present

## 2018-07-06 DIAGNOSIS — R627 Adult failure to thrive: Secondary | ICD-10-CM | POA: Diagnosis present

## 2018-07-06 DIAGNOSIS — I129 Hypertensive chronic kidney disease with stage 1 through stage 4 chronic kidney disease, or unspecified chronic kidney disease: Secondary | ICD-10-CM | POA: Diagnosis present

## 2018-07-06 DIAGNOSIS — E079 Disorder of thyroid, unspecified: Secondary | ICD-10-CM | POA: Diagnosis present

## 2018-07-06 DIAGNOSIS — R4182 Altered mental status, unspecified: Secondary | ICD-10-CM | POA: Diagnosis not present

## 2018-07-06 DIAGNOSIS — Z515 Encounter for palliative care: Secondary | ICD-10-CM | POA: Diagnosis not present

## 2018-07-06 DIAGNOSIS — Z20828 Contact with and (suspected) exposure to other viral communicable diseases: Secondary | ICD-10-CM | POA: Diagnosis present

## 2018-07-06 DIAGNOSIS — I639 Cerebral infarction, unspecified: Secondary | ICD-10-CM | POA: Diagnosis present

## 2018-07-06 DIAGNOSIS — E7800 Pure hypercholesterolemia, unspecified: Secondary | ICD-10-CM | POA: Diagnosis not present

## 2018-07-06 LAB — BASIC METABOLIC PANEL
Anion gap: 12 (ref 5–15)
BUN: 25 mg/dL — ABNORMAL HIGH (ref 8–23)
CO2: 26 mmol/L (ref 22–32)
Calcium: >15 mg/dL (ref 8.9–10.3)
Chloride: 115 mmol/L — ABNORMAL HIGH (ref 98–111)
Creatinine, Ser: 3.01 mg/dL — ABNORMAL HIGH (ref 0.44–1.00)
GFR calc Af Amer: 15 mL/min — ABNORMAL LOW (ref >60)
GFR calc non Af Amer: 13 mL/min — ABNORMAL LOW (ref >60)
Glucose, Bld: 104 mg/dL — ABNORMAL HIGH (ref 70–99)
Potassium: 2.7 mmol/L — CL (ref 3.5–5.1)
Sodium: 153 mmol/L — ABNORMAL HIGH (ref 135–145)

## 2018-07-06 LAB — CBC WITH DIFFERENTIAL/PLATELET
Abs Immature Granulocytes: 0.03 10*3/uL (ref 0.00–0.07)
Basophils Absolute: 0 10*3/uL (ref 0.0–0.1)
Basophils Relative: 0 %
Eosinophils Absolute: 0.2 10*3/uL (ref 0.0–0.5)
Eosinophils Relative: 2 %
HCT: 39.9 % (ref 36.0–46.0)
Hemoglobin: 11.3 g/dL — ABNORMAL LOW (ref 12.0–15.0)
Immature Granulocytes: 0 %
Lymphocytes Relative: 10 %
Lymphs Abs: 0.9 10*3/uL (ref 0.7–4.0)
MCH: 27.9 pg (ref 26.0–34.0)
MCHC: 28.3 g/dL — ABNORMAL LOW (ref 30.0–36.0)
MCV: 98.5 fL (ref 80.0–100.0)
Monocytes Absolute: 1 10*3/uL (ref 0.1–1.0)
Monocytes Relative: 11 %
Neutro Abs: 7 10*3/uL (ref 1.7–7.7)
Neutrophils Relative %: 77 %
Platelets: 146 10*3/uL — ABNORMAL LOW (ref 150–400)
RBC: 4.05 MIL/uL (ref 3.87–5.11)
RDW: 15.5 % (ref 11.5–15.5)
WBC: 9.2 10*3/uL (ref 4.0–10.5)
nRBC: 0.2 % (ref 0.0–0.2)

## 2018-07-06 LAB — URINALYSIS, ROUTINE W REFLEX MICROSCOPIC
Bilirubin Urine: NEGATIVE
Glucose, UA: NEGATIVE mg/dL
Ketones, ur: NEGATIVE mg/dL
Leukocytes,Ua: NEGATIVE
Nitrite: NEGATIVE
Protein, ur: 30 mg/dL — AB
Specific Gravity, Urine: 1.008 (ref 1.005–1.030)
pH: 6 (ref 5.0–8.0)

## 2018-07-06 LAB — OSMOLALITY, URINE: Osmolality, Ur: 256 mOsm/kg — ABNORMAL LOW (ref 300–900)

## 2018-07-06 LAB — COMPREHENSIVE METABOLIC PANEL
ALT: 16 U/L (ref 0–44)
AST: 39 U/L (ref 15–41)
Albumin: 2.5 g/dL — ABNORMAL LOW (ref 3.5–5.0)
Alkaline Phosphatase: 79 U/L (ref 38–126)
Anion gap: 9 (ref 5–15)
BUN: 22 mg/dL (ref 8–23)
CO2: 21 mmol/L — ABNORMAL LOW (ref 22–32)
Calcium: >15 mg/dL (ref 8.9–10.3)
Chloride: 123 mmol/L — ABNORMAL HIGH (ref 98–111)
Creatinine, Ser: 2.5 mg/dL — ABNORMAL HIGH (ref 0.44–1.00)
GFR calc Af Amer: 19 mL/min — ABNORMAL LOW (ref >60)
GFR calc non Af Amer: 16 mL/min — ABNORMAL LOW (ref >60)
Glucose, Bld: 76 mg/dL (ref 70–99)
Potassium: 3.2 mmol/L — ABNORMAL LOW (ref 3.5–5.1)
Sodium: 153 mmol/L — ABNORMAL HIGH (ref 135–145)
Total Bilirubin: 2.4 mg/dL — ABNORMAL HIGH (ref 0.3–1.2)
Total Protein: 5.1 g/dL — ABNORMAL LOW (ref 6.5–8.1)

## 2018-07-06 LAB — GLUCOSE, CAPILLARY: Glucose-Capillary: 84 mg/dL (ref 70–99)

## 2018-07-06 LAB — SODIUM, URINE, RANDOM: Sodium, Ur: 49 mmol/L

## 2018-07-06 LAB — TSH: TSH: 0.659 u[IU]/mL (ref 0.350–4.500)

## 2018-07-06 LAB — CORTISOL: Cortisol, Plasma: 41.4 ug/dL

## 2018-07-06 LAB — OSMOLALITY: Osmolality: 331 mOsm/kg (ref 275–295)

## 2018-07-06 LAB — MAGNESIUM: Magnesium: 2.5 mg/dL — ABNORMAL HIGH (ref 1.7–2.4)

## 2018-07-06 LAB — CREATININE, URINE, RANDOM: Creatinine, Urine: 61.71 mg/dL

## 2018-07-06 LAB — SARS CORONAVIRUS 2 BY RT PCR (HOSPITAL ORDER, PERFORMED IN ~~LOC~~ HOSPITAL LAB): SARS Coronavirus 2: NEGATIVE

## 2018-07-06 MED ORDER — DEXTROSE-NACL 5-0.45 % IV SOLN
INTRAVENOUS | Status: DC
  Administered 2018-07-06: 23:00:00 via INTRAVENOUS

## 2018-07-06 MED ORDER — IPRATROPIUM-ALBUTEROL 0.5-2.5 (3) MG/3ML IN SOLN
3.0000 mL | Freq: Four times a day (QID) | RESPIRATORY_TRACT | Status: DC
  Administered 2018-07-07 (×2): 3 mL via RESPIRATORY_TRACT
  Filled 2018-07-06 (×2): qty 3

## 2018-07-06 MED ORDER — ONDANSETRON HCL 4 MG PO TABS: 4.0000 mg | ORAL_TABLET | Freq: Four times a day (QID) | ORAL | Status: DC | PRN

## 2018-07-06 MED ORDER — CALCITONIN (SALMON) 200 UNIT/ML IJ SOLN
4.0000 [IU]/kg | Freq: Two times a day (BID) | INTRAMUSCULAR | Status: AC
  Administered 2018-07-06 – 2018-07-08 (×4): 236 [IU] via SUBCUTANEOUS
  Filled 2018-07-06 (×4): qty 1.18

## 2018-07-06 MED ORDER — POTASSIUM CHLORIDE 10 MEQ/100ML IV SOLN
10.0000 meq | INTRAVENOUS | Status: AC
  Administered 2018-07-06 – 2018-07-07 (×2): 10 meq via INTRAVENOUS
  Filled 2018-07-06: qty 100

## 2018-07-06 MED ORDER — ACETAMINOPHEN 325 MG PO TABS: 650.0000 mg | ORAL_TABLET | Freq: Four times a day (QID) | ORAL | Status: DC | PRN

## 2018-07-06 MED ORDER — HYDRALAZINE HCL 20 MG/ML IJ SOLN: 5.0000 mg | INTRAMUSCULAR | Status: DC | PRN

## 2018-07-06 MED ORDER — SODIUM CHLORIDE 0.9 % IV SOLN: INTRAVENOUS | Status: DC

## 2018-07-06 MED ORDER — SODIUM CHLORIDE 0.9 % IV BOLUS
500.0000 mL | Freq: Once | INTRAVENOUS | Status: AC
  Administered 2018-07-06: 500 mL via INTRAVENOUS

## 2018-07-06 MED ORDER — HEPARIN SODIUM (PORCINE) 5000 UNIT/ML IJ SOLN
5000.0000 [IU] | Freq: Three times a day (TID) | INTRAMUSCULAR | Status: DC
  Administered 2018-07-06 – 2018-07-09 (×9): 5000 [IU] via SUBCUTANEOUS
  Filled 2018-07-06 (×9): qty 1

## 2018-07-06 MED ORDER — ONDANSETRON HCL 4 MG/2ML IJ SOLN: 4.0000 mg | Freq: Four times a day (QID) | INTRAMUSCULAR | Status: DC | PRN

## 2018-07-06 MED ORDER — ACETAMINOPHEN 650 MG RE SUPP: 650.0000 mg | Freq: Four times a day (QID) | RECTAL | Status: DC | PRN

## 2018-07-06 MED ORDER — ALBUTEROL SULFATE (2.5 MG/3ML) 0.083% IN NEBU: 2.5000 mg | INHALATION_SOLUTION | RESPIRATORY_TRACT | Status: DC | PRN

## 2018-07-06 NOTE — Consult Note (Signed)
Reason for Consult:AKI,. Hypernatremia, hypercalcemia Referring Physician: Dr. Donell Sievert is an 85 y.o. female.  HPI: 19 yr femal with hx HTn, CVA 5/19, ^lipids, PVD, RA, Visual loss, Thyroid mass, breast surgery (?cause), dementia, now with progressive obtundation, lethargy (? Duration).  Aske to see as Cr ^, .84 to 2.5 over past 53yr (no interim values available).  Ca >15, SNa 153, and bicarb of 21.  No mention of Ca or Vit D in home meds.  She cannot give hx.  Ca was ^ 6/19 tyo 10.5 with alb of 3.4.  Has bili of 2.4. and alb of 2.5 now.   Review of systems not obtained due to patient factors.  Past Medical History:  Diagnosis Date  . Acute loss of vision, bilateral 06/16/2017  . Anxiety   . Bronchitis   . Cerebral embolism with cerebral infarction 06/18/2017  . COPD (chronic obstructive pulmonary disease) ([place])   . Glaucoma   . Hyperlipidemia LDL goal <70 06/19/2017  . Hypertension   . Peripheral vascular disease ([place])   . Rheumatoid arthritis ([place])   . Stenosis of left carotid artery   . Stroke ([place])   . Thyroid mass 06/18/2017  . Vertigo     Past Surgical History:  Procedure Laterality Date  . ABDOMINAL HYSTERECTOMY    . BREAST SURGERY    . RADIOLOGY WITH ANESTHESIA N/A 06/17/2017   Procedure: RADIOLOGY WITH ANESTHESIA;  Surgeon: Radiologist, Medication, MD;  Location: [facility];  Service: Radiology;  Laterality: N/A;    Family History  Problem Relation Age of Onset  . Hypertension Other     Social History:  reports that she has never smoked. She has never used smokeless tobacco. She reports that she does not drink alcohol or use drugs.  Allergies:  Allergies  Allergen Reactions  . Keflex [Cephalexin] Other (See Comments)    Unknown reaction  . Pletal [Cilostazol] Other (See Comments)    Unknown reaction    Medications: I have reviewed the patient's current medications. Prior to Admission: (Not in a hospital admission)  Results for orders placed or  performed during the hospital encounter of 07/06/18 (from the past 48 hour(s))  CBC with Differential/Platelet     Status: Abnormal   Collection Time: 07/06/18  4:31 PM  Result Value Ref Range   WBC 9.2 4.0 - 10.5 K/uL   RBC 4.05 3.87 - 5.11 MIL/uL   Hemoglobin 11.3 (L) 12.0 - 15.0 g/dL   HCT 39.9 36.0 - 46.0 %   MCV 98.5 80.0 - 100.0 fL   MCH 27.9 26.0 - 34.0 pg   MCHC 28.3 (L) 30.0 - 36.0 g/dL   RDW 15.5 11.5 - 15.5 %   Platelets 146 (L) 150 - 400 K/uL   nRBC 0.2 0.0 - 0.2 %   Neutrophils Relative % 77 %   Neutro Abs 7.0 1.7 - 7.7 K/uL   Lymphocytes Relative 10 %   Lymphs Abs 0.9 0.7 - 4.0 K/uL   Monocytes Relative 11 %   Monocytes Absolute 1.0 0.1 - 1.0 K/uL   Eosinophils Relative 2 %   Eosinophils Absolute 0.2 0.0 - 0.5 K/uL   Basophils Relative 0 %   Basophils Absolute 0.0 0.0 - 0.1 K/uL   Immature Granulocytes 0 %   Abs Immature Granulocytes 0.03 0.00 - 0.07 K/uL    Comment: Performed at [facility], [address], [place], [place] [address]  Comprehensive metabolic panel     Status: Abnormal  Collection Time: 07/06/18  4:31 PM  Result Value Ref Range   Sodium 153 (H) 135 - 145 mmol/L   Potassium 3.2 (L) 3.5 - 5.1 mmol/L    Comment: HEMOLYSIS AT THIS LEVEL MAY AFFECT RESULT   Chloride 123 (H) 98 - 111 mmol/L   CO2 21 (L) 22 - 32 mmol/L   Glucose, Bld 76 70 - 99 mg/dL   BUN 22 8 - 23 mg/dL   Creatinine, Ser 2.50 (H) 0.44 - 1.00 mg/dL   Calcium >15.0 (HH) 8.9 - 10.3 mg/dL    Comment: REPEATED TO VERIFY HEMOLYSIS AT THIS LEVEL MAY AFFECT RESULT M SCRUGGS,RN 1824 07/06/2018 WBOND    Total Protein 5.1 (L) 6.5 - 8.1 g/dL   Albumin 2.5 (L) 3.5 - 5.0 g/dL   AST 39 15 - 41 U/L   ALT 16 0 - 44 U/L   Alkaline Phosphatase 79 38 - 126 U/L   Total Bilirubin 2.4 (H) 0.3 - 1.2 mg/dL   GFR calc non Af Amer 16 (L) >60 mL/min   GFR calc Af Amer 19 (L) >60 mL/min   Anion gap 9 5 - 15    Comment: Performed at [facility] [address], [place], [place] [address]   SARS Coronavirus 2 (CEPHEID - Performed in [facility] hospital lab), Hosp Order     Status: None   Collection Time: 07/06/18  5:15 PM  Result Value Ref Range   SARS Coronavirus 2 NEGATIVE NEGATIVE    Comment: (NOTE) If result is NEGATIVE SARS-CoV-2 target nucleic acids are NOT DETECTED. The SARS-CoV-2 RNA is generally detectable in upper and lower  respiratory specimens during the acute phase of infection. The lowest  concentration of SARS-CoV-2 viral copies this assay can detect is 250  copies / mL. A negative result does not preclude SARS-CoV-2 infection  and should not be used as the sole basis for treatment or other  patient management decisions.  A negative result may occur with  improper specimen collection / handling, submission of specimen other  than nasopharyngeal swab, presence of viral mutation(s) within the  areas targeted by this assay, and inadequate number of viral copies  (<250 copies / mL). A negative result must be combined with clinical  observations, patient history, and epidemiological information. If result is POSITIVE SARS-CoV-2 target nucleic acids are DETECTED. The SARS-CoV-2 RNA is generally detectable in upper and lower  respiratory specimens dur ing the acute phase of infection.  Positive  results are indicative of active infection with SARS-CoV-2.  Clinical  correlation with patient history and other diagnostic information is  necessary to determine patient infection status.  Positive results do  not rule out bacterial infection or co-infection with other viruses. If result is PRESUMPTIVE POSTIVE SARS-CoV-2 nucleic acids MAY BE PRESENT.   A presumptive positive result was obtained on the submitted specimen  and confirmed on repeat testing.  While 2019 novel coronavirus  (SARS-CoV-2) nucleic acids may be present in the submitted sample  additional confirmatory testing may be necessary for epidemiological  and / or clinical management purposes  to  differentiate between  SARS-CoV-2 and other Sarbecovirus currently known to infect humans.  If clinically indicated additional testing with an alternate test  methodology [phone]) is advised. The SARS-CoV-2 RNA is generally  detectable in upper and lower respiratory sp ecimens during the acute  phase of infection. The expected result is Negative. Fact Sheet for Patients:  StrictlyIdeas.no Fact Sheet for Healthcare Providers: BankingDealers.co.za This test is  not yet approved or cleared by the [place] and has been authorized for detection and/or diagnosis of SARS-CoV-2 by FDA under an Emergency Use Authorization (EUA).  This EUA will remain in effect (meaning this test can be used) for the duration of the COVID-19 declaration under Section 564(b)(1) of the Act, 21 U.S.C. section 360bbb-3(b)(1), unless the authorization is terminated or revoked sooner. Performed at [facility], [place] [address] [place], [place] [address]   Urinalysis, Routine w reflex microscopic     Status: Abnormal   Collection Time: 07/06/18  6:24 PM  Result Value Ref Range   Color, Urine YELLOW YELLOW   APPearance CLEAR CLEAR   Specific Gravity, Urine 1.008 1.005 - 1.030   pH 6.0 5.0 - 8.0   Glucose, UA NEGATIVE NEGATIVE mg/dL   Hgb urine dipstick LARGE (A) NEGATIVE   Bilirubin Urine NEGATIVE NEGATIVE   Ketones, ur NEGATIVE NEGATIVE mg/dL   Protein, ur 30 (A) NEGATIVE mg/dL   Nitrite NEGATIVE NEGATIVE   Leukocytes,Ua NEGATIVE NEGATIVE   RBC / HPF 11-20 0 - 5 RBC/hpf   WBC, UA 0-5 0 - 5 WBC/hpf   Bacteria, UA RARE (A) NONE SEEN   Squamous Epithelial / LPF 0-5 0 - 5    Comment: Performed at [facility], [place] [address] [place],  [address]    Ct Head Wo Contrast  Result Date: 07/06/2018 CLINICAL DATA:  Altered level of consciousness EXAM: CT HEAD WITHOUT CONTRAST TECHNIQUE: Contiguous axial images were obtained from the base of the  skull through the vertex without intravenous contrast. COMPARISON:  07/09/2017 FINDINGS: Brain: No evidence of acute infarction, hemorrhage, hydrocephalus, extra-axial collection or mass lesion/mass effect. Bilateral parietooccipital encephalomalacia is noted with significant progression on the left when compared to prior study. There is significant volume loss and extensive chronic microvascular ischemic changes. Vascular: No hyperdense vessel or unexpected calcification. Skull: Normal. Negative for fracture or focal lesion. Sinuses/Orbits: No acute finding. Other: None. IMPRESSION: 1. No acute intracranial abnormality detected. 2. Significant progression of encephalomalacia in involving the left parietooccipital region. Additional old infarcts are noted above. Electronically Signed   By: Constance Holster M.D.   On: 07/06/2018 19:03   Dg Chest Port 1 View  Result Date: 07/06/2018 CLINICAL DATA:  Weakness and fatigue has been getting severely worse for past 3 weeks EXAM: PORTABLE CHEST - 1 VIEW COMPARISON:  09/25/2014. FINDINGS: Chronic changes of left upper thoracoplasty. Progressive right paratracheal substernal goiter. No pulmonary infiltrate or edema. Heart size normal. No effusion. IMPRESSION: No acute cardiopulmonary disease. Electronically Signed   By: Lucrezia Europe M.D.   On: 07/06/2018 16:55    ROS Blood pressure (!) 153/91, pulse 92, temperature 98.7 F (37.1 C), temperature source Rectal, resp. rate 17, SpO2 96 %. Physical Exam Physical Examination: General appearance - babbling, eyes closed Mental status - slurred speech , cannot make out words.  Eyes - pupils equal and reactive, extraocular eye movements intact, hypertensive scarring in fundi Mouth - very dry, crusting Neck - adenopathy noted PCL, neck mass noted thyroid Lymphatics - posterior cervical nodes Chest - decreased bs, no R,R,W Heart - S1 and S2 normal, systolic murmur KY7/0 at 2nd left intercostal space and PMI 13 cm lat to  MSL Abdomen - soft, nontender, nondistended, no masses or organomegaly Neurological - moves all extrem to stim , sym facies, EOMs ok. Babbling. Extremities - no pedal edema, bilat fem bruits Skin - trophic changes in feet.  Assessment/Plan: 1 AKI  volume, plus hypercalcemic nephropathy, ??could have dysproteinemia also.  Need to tx reversible issues , ie vol, Ca. Not likely primary HPTH but poss.  Need cortisol, TSH.  Hold off ACE for now. 2 ^ Ca doubt milk alkali, suspect malig of some kind.  No evidence med cause,  3 Hypertension: not an issue not 4. Anemia mild and will be worse with hydration 5. ^ SNa free water losses and inability to concentrate urine with Jamas Lav. 6 Encephalopathy 7 CVA 8 Dementia P 1/2 Ns, K replete, expand vol, free water slowly.  Tx Ca with calictonin and vol . If not coming down will give pamidronate in 24h.  Check SPEP, PTh , consider bone scan.  Check TSH , cortisol, give steroids once get labs.  Jeneen Rinks Jestine Bicknell 07/06/2018, 8:11 PM

## 2018-07-06 NOTE — ED Notes (Signed)
Primary RN made aware Calcium greater than 15. She will inform md

## 2018-07-06 NOTE — ED Notes (Signed)
Patient transported to Ultrasound 

## 2018-07-06 NOTE — ED Notes (Signed)
Attempted to call report

## 2018-07-06 NOTE — ED Notes (Signed)
DNR [place] placed on pt

## 2018-07-06 NOTE — ED Provider Notes (Signed)
[facility] EMERGENCY DEPARTMENT Provider Note   CSN: [id number] Arrival date & time: 07/06/18  1541    History   Chief Complaint Chief Complaint  Patient presents with  . Altered Mental Status    HPI Stacie Bennett is a 85 y.o. female.     HPI Patient brought in by EMS for failure to thrive.  Patient is nonverbal.  Unable to contribute to history.  Spoke with patient's son by phone.  States she has been having a gradual decline over the last 2 to 3 weeks to the point that she is no longer able to walk or help transfer.  Over the last 2 to 3 days she is become nonverbal and drowsy.  She has not taken oral intake in the last 1 to 2 days.  Patient did have a fall striking her head while trying to transfer.  Patient has prior history of stroke. Past Medical History:  Diagnosis Date  . Acute loss of vision, bilateral 06/16/2017  . Anxiety   . Bronchitis   . Cerebral embolism with cerebral infarction 06/18/2017  . COPD (chronic obstructive pulmonary disease) ([place])   . Glaucoma   . Hyperlipidemia LDL goal <70 06/19/2017  . Hypertension   . Peripheral vascular disease ([place])   . Rheumatoid arthritis ([place])   . Stenosis of left carotid artery   . Stroke ([place])   . Thyroid mass 06/18/2017  . Vertigo     Patient Active Problem List   Diagnosis Date Noted  . Acute metabolic encephalopathy 16/96/7893  . Hypokalemia 07/06/2018  . Hypercalcemia 07/06/2018  . Hypernatremia 07/06/2018  . Acute renal failure superimposed on stage 3 chronic kidney disease ([place]) 07/06/2018  . Fall 07/06/2018  . Depression 07/06/2018  . Low TSH level 06/19/2017  . Hyperlipidemia LDL goal <70 06/19/2017  . Stroke ([place]) 06/19/2017  . Hyperlipidemia   . Cerebral embolism with cerebral infarction 06/18/2017  . Thyroid mass 06/18/2017  . Stenosis of left carotid artery   . COPD (chronic obstructive pulmonary disease) ([place]) 06/16/2017  . Hypertension 06/16/2017  . Acute loss of vision,  bilateral 06/16/2017    Past Surgical History:  Procedure Laterality Date  . ABDOMINAL HYSTERECTOMY    . BREAST SURGERY    . RADIOLOGY WITH ANESTHESIA N/A 06/17/2017   Procedure: RADIOLOGY WITH ANESTHESIA;  Surgeon: Radiologist, Medication, MD;  Location: [facility];  Service: Radiology;  Laterality: N/A;     OB History   No obstetric history on file.      Home Medications    Prior to Admission medications   Medication Sig Start Date End Date Taking? Authorizing Provider  albuterol (PROVENTIL HFA;VENTOLIN HFA) 108 (90 BASE) MCG/ACT inhaler Inhale 2 puffs into the lungs every 6 (six) hours as needed for wheezing or shortness of breath. 09/25/14   Horton, Barbette Hair, MD  amLODipine (NORVASC) 5 MG tablet Take 5 mg by mouth daily. 05/09/17   [provider]  aspirin 325 MG EC tablet Take 1 tablet (325 mg total) by mouth daily. Continue x 3 months then stop. 07/23/17   Venancio Poisson, NP  atorvastatin (LIPITOR) 80 MG tablet Take 1 tablet (80 mg total) by mouth daily at 6 PM. 07/23/17   Venancio Poisson, NP  busPIRone (BUSPAR) 5 MG tablet Take 1 tablet by mouth daily. 10/08/17   [provider]  cetirizine (ZYRTEC) 10 MG tablet Take 1 tablet by mouth daily. 09/30/17   [provider]  clopidogrel (PLAVIX) 75 MG tablet Take 1  tablet (75 mg total) by mouth daily. 06/20/17   Rama, Venetia Maxon, MD  mometasone-formoterol (DULERA) 100-5 MCG/ACT AERO Inhale 2 puffs into the lungs daily.    [provider]    Family History Family History  Problem Relation Age of Onset  . Hypertension Other     Social History Social History   Tobacco Use  . Smoking status: Never Smoker  . Smokeless tobacco: Never Used  Substance Use Topics  . Alcohol use: No  . Drug use: No     Allergies   Keflex [cephalexin] and Pletal [cilostazol]   Review of Systems Review of Systems  Unable to perform ROS: Mental status change     Physical Exam Updated Vital Signs BP  (!) 153/91   Pulse 92   Temp 98.7 F (37.1 C) (Rectal)   Resp 17   SpO2 96%   Physical Exam Vitals signs and nursing note reviewed.  Constitutional:      Appearance: She is well-developed.     Comments: Chronically ill-appearing.  HENT:     Head: Normocephalic and atraumatic.     Mouth/Throat:     Mouth: Mucous membranes are dry.  Eyes:     Pupils: Pupils are equal, round, and reactive to light.  Neck:     Musculoskeletal: Normal range of motion and neck supple. No neck rigidity or muscular tenderness.  Cardiovascular:     Rate and Rhythm: Normal rate and regular rhythm.     Heart sounds: No murmur. No friction rub. No gallop.   Pulmonary:     Effort: Pulmonary effort is normal.     Breath sounds: Normal breath sounds.  Abdominal:     General: Bowel sounds are normal.     Palpations: Abdomen is soft.     Tenderness: There is no abdominal tenderness. There is no guarding or rebound.  Musculoskeletal: Normal range of motion.        General: No swelling, tenderness, deformity or signs of injury.     Right lower leg: No edema.     Left lower leg: No edema.  Lymphadenopathy:     Cervical: No cervical adenopathy.  Skin:    General: Skin is warm and dry.     Findings: No erythema or rash.  Neurological:     Mental Status: She is alert.     Comments: Patient is drowsy.  Not following commands.  Does have tone in all of her extremities      ED Treatments / Results  Labs (all labs ordered are listed, but only abnormal results are displayed) Labs Reviewed  CBC WITH DIFFERENTIAL/PLATELET - Abnormal; Notable for the following components:      Result Value   Hemoglobin 11.3 (*)    MCHC 28.3 (*)    Platelets 146 (*)    All other components within normal limits  COMPREHENSIVE METABOLIC PANEL - Abnormal; Notable for the following components:   Sodium 153 (*)    Potassium 3.2 (*)    Chloride 123 (*)    CO2 21 (*)    Creatinine, Ser 2.50 (*)    Calcium >15.0 (*)    Total  Protein 5.1 (*)    Albumin 2.5 (*)    Total Bilirubin 2.4 (*)    GFR calc non Af Amer 16 (*)    GFR calc Af Amer 19 (*)    All other components within normal limits  URINALYSIS, ROUTINE W REFLEX MICROSCOPIC - Abnormal; Notable for the following components:  Hgb urine dipstick LARGE (*)    Protein, ur 30 (*)    Bacteria, UA RARE (*)    All other components within normal limits  SARS CORONAVIRUS 2 (HOSPITAL ORDER, [facility])  OSMOLALITY, URINE  OSMOLALITY  CREATININE, URINE, RANDOM  SODIUM, URINE, RANDOM  MAGNESIUM  PARATHYROID HORMONE, INTACT (NO CA)  PTH-RELATED PEPTIDE  CALCITRIOL (1,25 DI-OH VIT D)  BRAIN NATRIURETIC PEPTIDE  BASIC METABOLIC PANEL  BASIC METABOLIC PANEL  BASIC METABOLIC PANEL  BASIC METABOLIC PANEL    EKG EKG Interpretation  Date/Time:  Monday July 06 2018 15:47:53 EDT Ventricular Rate:  87 PR Interval:    QRS Duration: 89 QT Interval:  321 QTC Calculation: 387 R Axis:   18 Text Interpretation:  Sinus rhythm Repol abnrm, severe global ischemia (LM/MVD) Confirmed by Julianne Rice [phone]) on 07/06/2018 7:14:06 PM   Radiology Ct Head Wo Contrast  Result Date: 07/06/2018 CLINICAL DATA:  Altered level of consciousness EXAM: CT HEAD WITHOUT CONTRAST TECHNIQUE: Contiguous axial images were obtained from the base of the skull through the vertex without intravenous contrast. COMPARISON:  07/09/2017 FINDINGS: Brain: No evidence of acute infarction, hemorrhage, hydrocephalus, extra-axial collection or mass lesion/mass effect. Bilateral parietooccipital encephalomalacia is noted with significant progression on the left when compared to prior study. There is significant volume loss and extensive chronic microvascular ischemic changes. Vascular: No hyperdense vessel or unexpected calcification. Skull: Normal. Negative for fracture or focal lesion. Sinuses/Orbits: No acute finding. Other: None. IMPRESSION: 1. No acute intracranial  abnormality detected. 2. Significant progression of encephalomalacia in involving the left parietooccipital region. Additional old infarcts are noted above. Electronically Signed   By: Constance Holster M.D.   On: 07/06/2018 19:03   Dg Chest Port 1 View  Result Date: 07/06/2018 CLINICAL DATA:  Weakness and fatigue has been getting severely worse for past 3 weeks EXAM: PORTABLE CHEST - 1 VIEW COMPARISON:  09/25/2014. FINDINGS: Chronic changes of left upper thoracoplasty. Progressive right paratracheal substernal goiter. No pulmonary infiltrate or edema. Heart size normal. No effusion. IMPRESSION: No acute cardiopulmonary disease. Electronically Signed   By: Lucrezia Europe M.D.   On: 07/06/2018 16:55    Procedures Procedures (including critical care time)  Medications Ordered in ED Medications  potassium chloride 10 mEq in 100 mL IVPB (has no administration in time range)  0.9 %  sodium chloride infusion (has no administration in time range)  sodium chloride 0.9 % bolus 500 mL (0 mLs Intravenous Stopped 07/06/18 1830)  sodium chloride 0.9 % bolus 500 mL (500 mLs Intravenous New Bag/Given 07/06/18 1906)    CRITICAL CARE Performed by: Julianne Rice Total critical care time: 30 minutes Critical care time was exclusive of separately billable procedures and treating other patients. Critical care was necessary to treat or prevent imminent or life-threatening deterioration. Critical care was time spent personally by me on the following activities: development of treatment plan with patient and/or surrogate as well as nursing, discussions with consultants, evaluation of patient's response to treatment, examination of patient, obtaining history from patient or surrogate, ordering and performing treatments and interventions, ordering and review of laboratory studies, ordering and review of radiographic studies, pulse oximetry and re-evaluation of patient's condition. Initial Impression / Assessment and Plan /  ED Course  I have reviewed the triage vital signs and the nursing notes.  Pertinent labs & imaging results that were available during my care of the patient were reviewed by me and considered in my medical decision making (see chart  for details).       Patient with obvious dehydration.  Given IV fluids in the emergency department.  Severe electrolyte dysfunction.  Discussed with hospitalist who will admit.  Asked to have renal consult.  Discussed with Dr. Jimmy Footman who will consult on patient.  Suspect patient has metastatic disease given significant elevation of the potassium.   Final Clinical Impressions(s) / ED Diagnoses   Final diagnoses:  Altered mental status, unspecified altered mental status type  Dehydration  Hypercalcemia  Hypernatremia    ED Discharge Orders    None       Julianne Rice, MD 07/06/18 1942

## 2018-07-06 NOTE — ED Triage Notes (Signed)
Per [facility] pt coming from home where she lives with son. Per family pt started to decline about 2-3 weeks ago with her walking then fell. Patient now over the past 2-3 days not being able to communicate with the family.

## 2018-07-06 NOTE — ED Notes (Signed)
ED TO INPATIENT HANDOFF REPORT  ED Nurse Name and Phone #: [phone]  S Name/Age/Gender Stacie Bennett 85 y.o. female Room/Bed: 028C/028C  Code Status   Code Status: DNR  Home/SNF/Other Home Patient oriented to: self Is this baseline? Yes   Triage Complete: Triage complete  Chief Complaint AMS  Triage Note Per [facility] pt coming from home where she lives with son. Per family pt started to decline about 2-3 weeks ago with her walking then fell. Patient now over the past 2-3 days not being able to communicate with the family.    Allergies Allergies  Allergen Reactions  . Keflex [Cephalexin] Other (See Comments)    Unknown reaction  . Pletal [Cilostazol] Other (See Comments)    Unknown reaction    Level of Care/Admitting Diagnosis ED Disposition    ED Disposition Condition Comment   Admit  Hospital Area: [place] [[id number]]  Level of Care: Progressive [102]  Covid Evaluation: N/A  Diagnosis: Acute metabolic encephalopathy [[id number]]  Admitting Physician: Ivor Costa [[id number]]  Attending Physician: Ivor Costa [phone]  Estimated length of stay: past midnight tomorrow  Certification:: I certify this patient will need inpatient services for at least 2 midnights  PT Class (Do Not Modify): Inpatient [101]  PT Acc Code (Do Not Modify): Private [1]       B Medical/Surgery History Past Medical History:  Diagnosis Date  . Acute loss of vision, bilateral 06/16/2017  . Anxiety   . Bronchitis   . Cerebral embolism with cerebral infarction 06/18/2017  . COPD (chronic obstructive pulmonary disease) ([place])   . Glaucoma   . Hyperlipidemia LDL goal <70 06/19/2017  . Hypertension   . Peripheral vascular disease ([place])   . Rheumatoid arthritis ([place])   . Stenosis of left carotid artery   . Stroke ([place])   . Thyroid mass 06/18/2017  . Vertigo    Past Surgical History:  Procedure Laterality Date  . ABDOMINAL HYSTERECTOMY    . BREAST SURGERY    . RADIOLOGY WITH  ANESTHESIA N/A 06/17/2017   Procedure: RADIOLOGY WITH ANESTHESIA;  Surgeon: Radiologist, Medication, MD;  Location: [facility];  Service: Radiology;  Laterality: N/A;     A IV Location/Drains/Wounds Patient Lines/Drains/Airways Status   Active Line/Drains/Airways    Name:   Placement date:   Placement time:   Site:   Days:   Peripheral IV 07/06/18 Right Hand   07/06/18    1700    Hand   less than 1   Airway   06/17/17    1255     384          Intake/Output Last 24 hours  Intake/Output Summary (Last 24 hours) at 07/06/2018 2144 Last data filed at 07/06/2018 1830 Gross per 24 hour  Intake 500 ml  Output -  Net 500 ml    Labs/Imaging Results for orders placed or performed during the hospital encounter of 07/06/18 (from the past 48 hour(s))  CBC with Differential/Platelet     Status: Abnormal   Collection Time: 07/06/18  4:31 PM  Result Value Ref Range   WBC 9.2 4.0 - 10.5 K/uL   RBC 4.05 3.87 - 5.11 MIL/uL   Hemoglobin 11.3 (L) 12.0 - 15.0 g/dL   HCT 39.9 36.0 - 46.0 %   MCV 98.5 80.0 - 100.0 fL   MCH 27.9 26.0 - 34.0 pg   MCHC 28.3 (L) 30.0 - 36.0 g/dL   RDW 15.5 11.5 - 15.5 %   Platelets 146 (L) 150 -  400 K/uL   nRBC 0.2 0.0 - 0.2 %   Neutrophils Relative % 77 %   Neutro Abs 7.0 1.7 - 7.7 K/uL   Lymphocytes Relative 10 %   Lymphs Abs 0.9 0.7 - 4.0 K/uL   Monocytes Relative 11 %   Monocytes Absolute 1.0 0.1 - 1.0 K/uL   Eosinophils Relative 2 %   Eosinophils Absolute 0.2 0.0 - 0.5 K/uL   Basophils Relative 0 %   Basophils Absolute 0.0 0.0 - 0.1 K/uL   Immature Granulocytes 0 %   Abs Immature Granulocytes 0.03 0.00 - 0.07 K/uL    Comment: Performed at [facility] [address] [place], [place] [address]  Comprehensive metabolic panel     Status: Abnormal   Collection Time: 07/06/18  4:31 PM  Result Value Ref Range   Sodium 153 (H) 135 - 145 mmol/L   Potassium 3.2 (L) 3.5 - 5.1 mmol/L    Comment: HEMOLYSIS AT THIS LEVEL MAY AFFECT RESULT   Chloride 123 (H) 98 -  111 mmol/L   CO2 21 (L) 22 - 32 mmol/L   Glucose, Bld 76 70 - 99 mg/dL   BUN 22 8 - 23 mg/dL   Creatinine, Ser 2.50 (H) 0.44 - 1.00 mg/dL   Calcium >15.0 (HH) 8.9 - 10.3 mg/dL    Comment: REPEATED TO VERIFY HEMOLYSIS AT THIS LEVEL MAY AFFECT RESULT M SCRUGGS,RN 1824 07/06/2018 WBOND    Total Protein 5.1 (L) 6.5 - 8.1 g/dL   Albumin 2.5 (L) 3.5 - 5.0 g/dL   AST 39 15 - 41 U/L   ALT 16 0 - 44 U/L   Alkaline Phosphatase 79 38 - 126 U/L   Total Bilirubin 2.4 (H) 0.3 - 1.2 mg/dL   GFR calc non Af Amer 16 (L) >60 mL/min   GFR calc Af Amer 19 (L) >60 mL/min   Anion gap 9 5 - 15    Comment: Performed at [facility], [place] [address] [place], [place] [address]  SARS Coronavirus 2 (CEPHEID - Performed in [place] hospital lab), Hosp Order     Status: None   Collection Time: 07/06/18  5:15 PM  Result Value Ref Range   SARS Coronavirus 2 NEGATIVE NEGATIVE    Comment: (NOTE) If result is NEGATIVE SARS-CoV-2 target nucleic acids are NOT DETECTED. The SARS-CoV-2 RNA is generally detectable in upper and lower  respiratory specimens during the acute phase of infection. The lowest  concentration of SARS-CoV-2 viral copies this assay can detect is 250  copies / mL. A negative result does not preclude SARS-CoV-2 infection  and should not be used as the sole basis for treatment or other  patient management decisions.  A negative result may occur with  improper specimen collection / handling, submission of specimen other  than nasopharyngeal swab, presence of viral mutation(s) within the  areas targeted by this assay, and inadequate number of viral copies  (<250 copies / mL). A negative result must be combined with clinical  observations, patient history, and epidemiological information. If result is POSITIVE SARS-CoV-2 target nucleic acids are DETECTED. The SARS-CoV-2 RNA is generally detectable in upper and lower  respiratory specimens dur ing the acute phase of infection.  Positive   results are indicative of active infection with SARS-CoV-2.  Clinical  correlation with patient history and other diagnostic information is  necessary to determine patient infection status.  Positive results do  not rule out bacterial infection or co-infection with other viruses.  If result is PRESUMPTIVE POSTIVE SARS-CoV-2 nucleic acids MAY BE PRESENT.   A presumptive positive result was obtained on the submitted specimen  and confirmed on repeat testing.  While 2019 novel coronavirus  (SARS-CoV-2) nucleic acids may be present in the submitted sample  additional confirmatory testing may be necessary for epidemiological  and / or clinical management purposes  to differentiate between  SARS-CoV-2 and other Sarbecovirus currently known to infect humans.  If clinically indicated additional testing with an alternate test  methodology [phone]) is advised. The SARS-CoV-2 RNA is generally  detectable in upper and lower respiratory sp ecimens during the acute  phase of infection. The expected result is Negative. Fact Sheet for Patients:  StrictlyIdeas.no Fact Sheet for Healthcare Providers: BankingDealers.co.za This test is not yet approved or cleared by the [place] FDA and has been authorized for detection and/or diagnosis of SARS-CoV-2 by FDA under an Emergency Use Authorization (EUA).  This EUA will remain in effect (meaning this test can be used) for the duration of the COVID-19 declaration under Section 564(b)(1) of the Act, 21 U.S.C. section 360bbb-3(b)(1), unless the authorization is terminated or revoked sooner. Performed at [facility], [place] [address] [place], [place] [address]   Urinalysis, Routine w reflex microscopic     Status: Abnormal   Collection Time: 07/06/18  6:24 PM  Result Value Ref Range   Color, Urine YELLOW YELLOW   APPearance CLEAR CLEAR   Specific Gravity, Urine 1.008 1.005 - 1.030   pH 6.0 5.0 - 8.0    Glucose, UA NEGATIVE NEGATIVE mg/dL   Hgb urine dipstick LARGE (A) NEGATIVE   Bilirubin Urine NEGATIVE NEGATIVE   Ketones, ur NEGATIVE NEGATIVE mg/dL   Protein, ur 30 (A) NEGATIVE mg/dL   Nitrite NEGATIVE NEGATIVE   Leukocytes,Ua NEGATIVE NEGATIVE   RBC / HPF 11-20 0 - 5 RBC/hpf   WBC, UA 0-5 0 - 5 WBC/hpf   Bacteria, UA RARE (A) NONE SEEN   Squamous Epithelial / LPF 0-5 0 - 5    Comment: Performed at [facility], [place] [address] [place], [place] [address]   Ct Head Wo Contrast  Result Date: 07/06/2018 CLINICAL DATA:  Altered level of consciousness EXAM: CT HEAD WITHOUT CONTRAST TECHNIQUE: Contiguous axial images were obtained from the base of the skull through the vertex without intravenous contrast. COMPARISON:  07/09/2017 FINDINGS: Brain: No evidence of acute infarction, hemorrhage, hydrocephalus, extra-axial collection or mass lesion/mass effect. Bilateral parietooccipital encephalomalacia is noted with significant progression on the left when compared to prior study. There is significant volume loss and extensive chronic microvascular ischemic changes. Vascular: No hyperdense vessel or unexpected calcification. Skull: Normal. Negative for fracture or focal lesion. Sinuses/Orbits: No acute finding. Other: None. IMPRESSION: 1. No acute intracranial abnormality detected. 2. Significant progression of encephalomalacia in involving the left parietooccipital region. Additional old infarcts are noted above. Electronically Signed   By: Constance Holster M.D.   On: 07/06/2018 19:03   US Renal  Result Date: 07/06/2018 CLINICAL DATA:  Acute kidney injury EXAM: RENAL / URINARY TRACT ULTRASOUND COMPLETE COMPARISON:  None. FINDINGS: Right Kidney: Renal measurements: 8.5 x 3.6 x 3.9 cm = volume: 62 mL . Echogenicity within normal limits. No mass or hydronephrosis visualized. Left Kidney: Renal measurements: 8.9 x 4.7 x 3.7 cm = volume: 79 mL. Echogenicity within normal limits. No mass or  hydronephrosis visualized. Bladder: Appears normal for degree of bladder distention. Both ureteral jets were visualized. IMPRESSION: No acute sonographic abnormality. No  specific abnormality to explain the patient's acute kidney injury. Electronically Signed   By: Constance Holster M.D.   On: 07/06/2018 21:39   Dg Chest Port 1 View  Result Date: 07/06/2018 CLINICAL DATA:  Weakness and fatigue has been getting severely worse for past 3 weeks EXAM: PORTABLE CHEST - 1 VIEW COMPARISON:  09/25/2014. FINDINGS: Chronic changes of left upper thoracoplasty. Progressive right paratracheal substernal goiter. No pulmonary infiltrate or edema. Heart size normal. No effusion. IMPRESSION: No acute cardiopulmonary disease. Electronically Signed   By: Lucrezia Europe M.D.   On: 07/06/2018 16:55    Pending Labs Unresulted Labs (From admission, onward)    Start     Ordered   07/07/18 0500  CBC  Tomorrow morning,   R     07/06/18 1953   07/06/18 2011  Cortisol  Once,   R     07/06/18 2010   07/06/18 2011  TSH  Once,   R     07/06/18 2010   07/06/18 2011  Protein electrophoresis, serum  Once,   R     07/06/18 2010   07/06/18 2011  Immunofixation electrophoresis  Once,   R     07/06/18 2010   07/06/18 7510  Basic metabolic panel  Now then every 4 hours,   R     07/06/18 1939   07/06/18 1938  Parathyroid hormone, intact (no Ca)  Once,   R     07/06/18 1937   07/06/18 1938  PTH-related peptide  Once,   R     07/06/18 1937   07/06/18 1938  Calcitriol (1,25 di-OH Vit D)  Once,   R     07/06/18 1937   07/06/18 1937  Creatinine, urine, random  Once,   R     07/06/18 1936   07/06/18 1937  Sodium, urine, random  Once,   R     07/06/18 1936   07/06/18 1937  Magnesium  Once,   R     07/06/18 1936   07/06/18 1936  Osmolality, urine  Once,   R     07/06/18 1936   07/06/18 1936  Osmolality  Once,   R     07/06/18 1936          Vitals/Pain Today's Vitals   07/06/18 1700 07/06/18 1730 07/06/18 1800 07/06/18 2000   BP: (!) 146/69 (!) 157/88 (!) 153/91 (!) 147/97  Pulse: 87 90 92   Resp: 16 16 17 16   Temp:      TempSrc:      SpO2: 97% 97% 96%     Isolation Precautions No active isolations  Medications Medications  potassium chloride 10 mEq in 100 mL IVPB (has no administration in time range)  heparin injection 5,000 Units (has no administration in time range)  acetaminophen (TYLENOL) tablet 650 mg (has no administration in time range)    Or  acetaminophen (TYLENOL) suppository 650 mg (has no administration in time range)  ondansetron (ZOFRAN) tablet 4 mg (has no administration in time range)    Or  ondansetron (ZOFRAN) injection 4 mg (has no administration in time range)  dextrose 5 %-0.45 % sodium chloride infusion (has no administration in time range)  calcitonin (MIACALCIN) injection 4 Units/kg (has no administration in time range)  hydrALAZINE (APRESOLINE) injection 5 mg (has no administration in time range)  sodium chloride 0.9 % bolus 500 mL (0 mLs Intravenous Stopped 07/06/18 1830)  sodium chloride 0.9 % bolus 500 mL (500 mLs Intravenous New  Bag/Given 07/06/18 1906)    Mobility non-ambulatory High fall risk   Focused Assessments   R Recommendations: See Admitting Provider Note  Report given to:   Additional Notes:

## 2018-07-06 NOTE — H&P (Signed)
History and Physical    [name] PFX:[id number] DOB: [birth year] DOA: 07/06/2018  Referring MD/NP/PA:   PCP: Nolene Ebbs, MD   Patient coming from:  The patient is coming from home.  At baseline, pt is dependent for most of ADL.        Chief Complaint: AMS  HPI: Stacie Bennett is a 85 y.o. female with medical history significant of hypertension, hyperlipidemia, COPD, stroke, depression, bilateral vision loss, PVD, vertigo, CKD stage III, thyroid mass, dementia, who presents with altered mental status.  Patient has AMS, and is unable to provide accurate medical history. Per his son who is living with pt at home, pt has dementia, at normal baseline pt recognize family members, and could walk with her son's help.  Patient is not orientated to time mostly and place. Pt has been having a gradual decline over the last 2 to 3 weeks to the point that she is no longer able to walk or help transfer. Over the last 2 to 3 days she stopped talking and becomes very drowsy and more confused. Pt has not taken oral intake in the last 1 to 2 days.  Patient had a fall striking her head while trying to transfer. When I saw pt in ED, pt is confused, not oriented x 3, not following commands. No active nausea, vomiting, diarrhea noted. No acute respiratory distress, active cough noted.  Not sure if patient has any chest pain or abdominal pain, but she does not seem to have abdominal tenderness on physical examination.  Patient moves both arms on painful stimuli, but moves both legs only minimally on painful stimuli.  ED Course: pt was found to have WBC 9.2, negative COVID-19 test, negative urinalysis, worsening renal function, sodium 153, calcium>15, potassium 3.2, temperature normal, temperature normal, blood pressure 153/91, heart rate 90s, RR 17, oxygen saturation 96% on room air.  Chest x-ray negative.  CT head is negative for acute intracranial abnormalities, but showed significant progression of  encephalomalacia in involving the left parietooccipital region and old infarcts. Pt is admitted to [facility] as inpt. Renal, Dr. Jimmy Footman was consulted by EDP.  Review of Systems: Could not be reviewed due to altered mental status    Allergy:  Allergies  Allergen Reactions   Keflex [Cephalexin] Other (See Comments)    Unknown reaction   Pletal [Cilostazol] Other (See Comments)    Unknown reaction    Past Medical History:  Diagnosis Date   Acute loss of vision, bilateral 06/16/2017   Anxiety    Bronchitis    Cerebral embolism with cerebral infarction 06/18/2017   COPD (chronic obstructive pulmonary disease) (HCC)    Glaucoma    Hyperlipidemia LDL goal <70 06/19/2017   Hypertension    Peripheral vascular disease (HCC)    Rheumatoid arthritis ([place])    Stenosis of left carotid artery    Stroke [facility])    Thyroid mass 06/18/2017   Vertigo     Past Surgical History:  Procedure Laterality Date   ABDOMINAL HYSTERECTOMY     BREAST SURGERY     RADIOLOGY WITH ANESTHESIA N/A 06/17/2017   Procedure: RADIOLOGY WITH ANESTHESIA;  Surgeon: Radiologist, Medication, MD;  Location: [facility];  Service: Radiology;  Laterality: N/A;    Social History:  reports that she has never smoked. She has never used smokeless tobacco. She reports that she does not drink alcohol or use drugs.  Family History:  Family History  Problem Relation Age of Onset   Hypertension Other  Prior to Admission medications   Medication Sig Start Date End Date Taking? Authorizing Provider  albuterol (PROVENTIL HFA;VENTOLIN HFA) 108 (90 BASE) MCG/ACT inhaler Inhale 2 puffs into the lungs every 6 (six) hours as needed for wheezing or shortness of breath. 09/25/14   Horton, Barbette Hair, MD  amLODipine (NORVASC) 5 MG tablet Take 5 mg by mouth daily. 05/09/17   [provider]  aspirin 325 MG EC tablet Take 1 tablet (325 mg total) by mouth daily. Continue x 3 months then stop. 07/23/17   Venancio Poisson, NP  atorvastatin (LIPITOR) 80 MG tablet Take 1 tablet (80 mg total) by mouth daily at 6 PM. 07/23/17   Venancio Poisson, NP  busPIRone (BUSPAR) 5 MG tablet Take 1 tablet by mouth daily. 10/08/17   [provider]  cetirizine (ZYRTEC) 10 MG tablet Take 1 tablet by mouth daily. 09/30/17   [provider]  clopidogrel (PLAVIX) 75 MG tablet Take 1 tablet (75 mg total) by mouth daily. 06/20/17   Rama, Venetia Maxon, MD  mometasone-formoterol (DULERA) 100-5 MCG/ACT AERO Inhale 2 puffs into the lungs daily.    [provider]    Physical Exam: Vitals:   07/06/18 2245 07/07/18 0158 07/07/18 0300 07/07/18 0359  BP: (!) 161/87   (!) 159/83  Pulse: (!) 109  97 (!) 101  Resp: 20 (!) 21 (!) 29 (!) 24  Temp: 99 F (37.2 C)   98.2 F (36.8 C)  TempSrc: Axillary   Axillary  SpO2: 95% 96% 95% 96%  Weight: 58.8 kg     Height: 5\' 5"  (1.651 m)      General: Not in acute distress. Dry mucus and membrane HEENT:       Eyes: PERRL, EOMI, no scleral icterus.       ENT: No discharge from the ears and nose, no pharynx injection, no tonsillar enlargement.        Neck: No JVD, no bruit, no mass felt. Heme: No neck lymph node enlargement. Cardiac: S1/S2, RRR, No murmurs, No gallops or rubs. Respiratory:  No rales, wheezing, rhonchi or rubs. GI: Soft, nondistended, nontender, no organomegaly, BS present. GU: No hematuria Ext: No pitting leg edema bilaterally. 2+DP/PT pulse bilaterally. Musculoskeletal: No joint deformities, No joint redness or warmth, no limitation of ROM in spin. Skin: No rashes.  Neuro: confused, not oriented X3, not following commands. Cranial nerves II-XII grossly intact. Patient moves both arms on painful stimuli, but moves both legs only minimally on painful stimuli. Psych: Patient is not psychotic.  Labs on Admission: I have personally reviewed following labs and imaging studies  CBC: Recent Labs  Lab 07/06/18 1631 07/07/18 0315  WBC 9.2 10.6*    NEUTROABS 7.0  --   HGB 11.3* 13.0  HCT 39.9 42.2  MCV 98.5 91.5  PLT 146* 409   Basic Metabolic Panel: Recent Labs  Lab 07/06/18 1631 07/06/18 2052 07/07/18 0221  NA 153* 153* 156*  K 3.2* 2.7* 3.1*  CL 123* 115* 122*  CO2 21* 26 22  GLUCOSE 76 104* 150*  BUN 22 25* 27*  CREATININE 2.50* 3.01* 2.95*  CALCIUM >15.0* >15.0* >15.0*  MG  --  2.5*  --    GFR: Estimated Creatinine Clearance: 11.6 mL/min (A) (by C-G formula based on SCr of 2.95 mg/dL (H)). Liver Function Tests: Recent Labs  Lab 07/06/18 1631  AST 39  ALT 16  ALKPHOS 79  BILITOT 2.4*  PROT 5.1*  ALBUMIN 2.5*   No results for input(s):  LIPASE, AMYLASE in the last 168 hours. No results for input(s): AMMONIA in the last 168 hours. Coagulation Profile: No results for input(s): INR, PROTIME in the last 168 hours. Cardiac Enzymes: No results for input(s): CKTOTAL, CKMB, CKMBINDEX, TROPONINI in the last 168 hours. BNP (last 3 results) No results for input(s): PROBNP in the last 8760 hours. HbA1C: No results for input(s): HGBA1C in the last 72 hours. CBG: Recent Labs  Lab 07/06/18 2250  GLUCAP 84   Lipid Profile: No results for input(s): CHOL, HDL, LDLCALC, TRIG, CHOLHDL, LDLDIRECT in the last 72 hours. Thyroid Function Tests: Recent Labs    07/06/18 2052  TSH 0.659   Anemia Panel: No results for input(s): VITAMINB12, FOLATE, FERRITIN, TIBC, IRON, RETICCTPCT in the last 72 hours. Urine analysis:    Component Value Date/Time   COLORURINE YELLOW 07/06/2018 1824   APPEARANCEUR CLEAR 07/06/2018 1824   LABSPEC 1.008 07/06/2018 1824   PHURINE 6.0 07/06/2018 1824   GLUCOSEU NEGATIVE 07/06/2018 1824   HGBUR LARGE (A) 07/06/2018 1824   BILIRUBINUR NEGATIVE 07/06/2018 1824   KETONESUR NEGATIVE 07/06/2018 1824   PROTEINUR 30 (A) 07/06/2018 1824   NITRITE NEGATIVE 07/06/2018 1824   LEUKOCYTESUR NEGATIVE 07/06/2018 1824   Sepsis Labs: @LABRCNTIP (procalcitonin:4,lacticidven:4) ) Recent Results  (from the past 240 hour(s))  SARS Coronavirus 2 (CEPHEID - Performed in [facility] hospital lab), Hosp Order     Status: None   Collection Time: 07/06/18  5:15 PM  Result Value Ref Range Status   SARS Coronavirus 2 NEGATIVE NEGATIVE Final    Comment: (NOTE) If result is NEGATIVE SARS-CoV-2 target nucleic acids are NOT DETECTED. The SARS-CoV-2 RNA is generally detectable in upper and lower  respiratory specimens during the acute phase of infection. The lowest  concentration of SARS-CoV-2 viral copies this assay can detect is 250  copies / mL. A negative result does not preclude SARS-CoV-2 infection  and should not be used as the sole basis for treatment or other  patient management decisions.  A negative result may occur with  improper specimen collection / handling, submission of specimen other  than nasopharyngeal swab, presence of viral mutation(s) within the  areas targeted by this assay, and inadequate number of viral copies  (<250 copies / mL). A negative result must be combined with clinical  observations, patient history, and epidemiological information. If result is POSITIVE SARS-CoV-2 target nucleic acids are DETECTED. The SARS-CoV-2 RNA is generally detectable in upper and lower  respiratory specimens dur ing the acute phase of infection.  Positive  results are indicative of active infection with SARS-CoV-2.  Clinical  correlation with patient history and other diagnostic information is  necessary to determine patient infection status.  Positive results do  not rule out bacterial infection or co-infection with other viruses. If result is PRESUMPTIVE POSTIVE SARS-CoV-2 nucleic acids MAY BE PRESENT.   A presumptive positive result was obtained on the submitted specimen  and confirmed on repeat testing.  While 2019 novel coronavirus  (SARS-CoV-2) nucleic acids may be present in the submitted sample  additional confirmatory testing may be necessary for epidemiological  and /  or clinical management purposes  to differentiate between  SARS-CoV-2 and other Sarbecovirus currently known to infect humans.  If clinically indicated additional testing with an alternate test  methodology [id number]) is advised. The SARS-CoV-2 RNA is generally  detectable in upper and lower respiratory sp ecimens during the acute  phase of infection. The expected result is Negative. Fact Sheet for Patients:  StrictlyIdeas.no Fact  Sheet for Healthcare Providers: BankingDealers.co.za This test is not yet approved or cleared by the [place] and has been authorized for detection and/or diagnosis of SARS-CoV-2 by FDA under an Emergency Use Authorization (EUA).  This EUA will remain in effect (meaning this test can be used) for the duration of the COVID-19 declaration under Section 564(b)(1) of the Act, 21 U.S.C. section 360bbb-3(b)(1), unless the authorization is terminated or revoked sooner. Performed at [facility], [place] [address], [place], [place] [address]   MRSA PCR Screening     Status: None   Collection Time: 07/06/18 11:37 PM  Result Value Ref Range Status   MRSA by PCR NEGATIVE NEGATIVE Final    Comment:        The GeneXpert MRSA Assay (FDA approved for NASAL specimens only), is one component of a comprehensive MRSA colonization surveillance program. It is not intended to diagnose MRSA infection nor to guide or monitor treatment for MRSA infections. Performed at [facility], [place] [address] [place], [place] [address]      Radiological Exams on Admission: Ct Head Wo Contrast  Result Date: 07/06/2018 CLINICAL DATA:  Altered level of consciousness EXAM: CT HEAD WITHOUT CONTRAST TECHNIQUE: Contiguous axial images were obtained from the base of the skull through the vertex without intravenous contrast. COMPARISON:  07/09/2017 FINDINGS: Brain: No evidence of acute infarction, hemorrhage, hydrocephalus,  extra-axial collection or mass lesion/mass effect. Bilateral parietooccipital encephalomalacia is noted with significant progression on the left when compared to prior study. There is significant volume loss and extensive chronic microvascular ischemic changes. Vascular: No hyperdense vessel or unexpected calcification. Skull: Normal. Negative for fracture or focal lesion. Sinuses/Orbits: No acute finding. Other: None. IMPRESSION: 1. No acute intracranial abnormality detected. 2. Significant progression of encephalomalacia in involving the left parietooccipital region. Additional old infarcts are noted above. Electronically Signed   By: Constance Holster M.D.   On: 07/06/2018 19:03   US Renal  Result Date: 07/06/2018 CLINICAL DATA:  Acute kidney injury EXAM: RENAL / URINARY TRACT ULTRASOUND COMPLETE COMPARISON:  None. FINDINGS: Right Kidney: Renal measurements: 8.5 x 3.6 x 3.9 cm = volume: 62 mL . Echogenicity within normal limits. No mass or hydronephrosis visualized. Left Kidney: Renal measurements: 8.9 x 4.7 x 3.7 cm = volume: 79 mL. Echogenicity within normal limits. No mass or hydronephrosis visualized. Bladder: Appears normal for degree of bladder distention. Both ureteral jets were visualized. IMPRESSION: No acute sonographic abnormality. No specific abnormality to explain the patient's acute kidney injury. Electronically Signed   By: Constance Holster M.D.   On: 07/06/2018 21:39   Dg Chest Port 1 View  Result Date: 07/06/2018 CLINICAL DATA:  Weakness and fatigue has been getting severely worse for past 3 weeks EXAM: PORTABLE CHEST - 1 VIEW COMPARISON:  09/25/2014. FINDINGS: Chronic changes of left upper thoracoplasty. Progressive right paratracheal substernal goiter. No pulmonary infiltrate or edema. Heart size normal. No effusion. IMPRESSION: No acute cardiopulmonary disease. Electronically Signed   By: Lucrezia Europe M.D.   On: 07/06/2018 16:55     EKG: Independently reviewed.  Sinus rhythm, QTC  387, LAE, mild ST depression in V3-V6 and the lateral leads   Assessment/Plan Principal Problem:   Acute metabolic encephalopathy Active Problems:   COPD (chronic obstructive pulmonary disease) (HCC)   Hypertension   Thyroid mass   Hyperlipidemia   Stroke (HCC)   Hypokalemia   Hypercalcemia   Hypernatremia   Acute renal failure superimposed on stage 3 chronic kidney disease (  [place])   Fall   Dehydration   Acute metabolic encephalopathy: Likely due to multifactorial etiology, including worsening renal function and severe electrolyte disturbance. CT-head negative for acute intracranial abnormalities.   -will admit to SDU as inpt -IVF for dehydration -Correct electrolytes disturbance as below -Frequent neuro check -hold all oral meds until mental status improves -keep pt NPO until mental status improves  Hypercalcemia: Ca>15.  Possibly due to dehydration, but the patient has history of thyroid mass. She had elevated PTH 218.5 on 06/06/2011 in the past, indicating possible hyperparathyroidism. Some malignancy is on differential diagnosis. Renal, Dr. Jimmy Footman was consulted. -Highly appreciated Dr. Deterding's consultation and follow-up recommendations -IVF: pt received 500 x 2 cc of normal saline in ED, will continue D5-1/2 NS at 125 cc/h per Dr. Jimmy Footman. -f/u BMP q6h -check PTH, PTH-related peptide, Vit D 1,25, TSH, cortisol, SPEP  Hypernatremia and dehydration: -IV fluid as above -f/u by BMP  Hypokalemia: K= 3.2 on admission. - Repleted - Check Mg level  Hx of stroke: -hold home Lipitor, Plavix, aspirin until mental status improves  Hypertension: -IV hydralazine as needed  COPD (chronic obstructive pulmonary disease) ([place]): stable -DuoNebs and prn albuterol nebs  Thyroid mass: -f/u TSH, PTH  Hyperlipidemia: -hold Lipitor now  Acute renal failure superimposed on stage 3 chronic kidney disease ([place]):  Baseline Cre is <1.0, pt's Cre is 2.50 and BUN 22 on admission.  Likely due to dehydration. - IVF as above - Follow up renal function by BMP -US-renal per Dr. Jimmy Footman  Fall: -pt/OT when able to (not ordered yet)   Inpatient status:  # Patient requires inpatient status due to high intensity of service, high risk for further deterioration and high frequency of surveillance required.  I certify that at the point of admission it is my clinical judgment that the patient will require inpatient hospital care spanning beyond 2 midnights from the point of admission.   This patient has multiple chronic comorbidities including hypertension, hyperlipidemia, COPD, stroke, depression, bilateral vision loss, PVD, vertigo, CKD stage III, thyroid mass, dementia   Now patient has presenting with AMS, multiple severe electrolyte disturbance, dehydration, fall  The worrisome physical exam findings include altered mental status  The initial radiographic and laboratory data are worrisome because of severe electrolytes disturbance including hypokalemia, hypernatremia, hypocalcemia, worsening renal function  Current medical needs: please see my assessment and plan  Predictability of an adverse outcome (risk): Patient is a multiple comorbidities, now presents with multiple severe electrolyte disturbance, altered mental status, dehydration and fall.  Her presentation is highly complicated.  Patient is at high risk for deteriorating.  Patient needs to be treated in hospital for at least 2 days.      DVT ppx: SQ Heparin    Code Status: DNR per his son who is POA Family Communication: son is at bed side.    Disposition Plan:  Anticipate discharge back to previous home environment Consults called:  none Admission status:  SDU/inpation       Date of Service 07/07/2018    Ivor Costa [facility]   If 7PM-7AM, please contact night-coverage [email or web address] Password [email or web address] 07/07/2018, 7:23 AM

## 2018-07-07 DIAGNOSIS — N179 Acute kidney failure, unspecified: Secondary | ICD-10-CM

## 2018-07-07 LAB — BASIC METABOLIC PANEL
Anion gap: 12 (ref 5–15)
Anion gap: 9 (ref 5–15)
Anion gap: 8 (ref 5–15)
Anion gap: 7 (ref 5–15)
BUN: 27 mg/dL — ABNORMAL HIGH (ref 8–23)
BUN: 26 mg/dL — ABNORMAL HIGH (ref 8–23)
BUN: 25 mg/dL — ABNORMAL HIGH (ref 8–23)
BUN: 25 mg/dL — ABNORMAL HIGH (ref 8–23)
CO2: 22 mmol/L (ref 22–32)
CO2: 27 mmol/L (ref 22–32)
CO2: 23 mmol/L (ref 22–32)
CO2: 25 mmol/L (ref 22–32)
Calcium: >15 mg/dL (ref 8.9–10.3)
Calcium: >15 mg/dL (ref 8.9–10.3)
Calcium: >15 mg/dL (ref 8.9–10.3)
Calcium: >15 mg/dL (ref 8.9–10.3)
Chloride: 122 mmol/L — ABNORMAL HIGH (ref 98–111)
Chloride: 118 mmol/L — ABNORMAL HIGH (ref 98–111)
Chloride: 122 mmol/L — ABNORMAL HIGH (ref 98–111)
Chloride: 119 mmol/L — ABNORMAL HIGH (ref 98–111)
Creatinine, Ser: 2.95 mg/dL — ABNORMAL HIGH (ref 0.44–1.00)
Creatinine, Ser: 2.94 mg/dL — ABNORMAL HIGH (ref 0.44–1.00)
Creatinine, Ser: 2.69 mg/dL — ABNORMAL HIGH (ref 0.44–1.00)
Creatinine, Ser: 2.58 mg/dL — ABNORMAL HIGH (ref 0.44–1.00)
GFR calc Af Amer: 16 mL/min — ABNORMAL LOW (ref >60)
GFR calc Af Amer: 16 mL/min — ABNORMAL LOW (ref >60)
GFR calc Af Amer: 17 mL/min — ABNORMAL LOW (ref >60)
GFR calc Af Amer: 18 mL/min — ABNORMAL LOW (ref >60)
GFR calc non Af Amer: 13 mL/min — ABNORMAL LOW (ref >60)
GFR calc non Af Amer: 14 mL/min — ABNORMAL LOW (ref >60)
GFR calc non Af Amer: 15 mL/min — ABNORMAL LOW (ref >60)
GFR calc non Af Amer: 16 mL/min — ABNORMAL LOW (ref >60)
Glucose, Bld: 150 mg/dL — ABNORMAL HIGH (ref 70–99)
Glucose, Bld: 191 mg/dL — ABNORMAL HIGH (ref 70–99)
Glucose, Bld: 156 mg/dL — ABNORMAL HIGH (ref 70–99)
Glucose, Bld: 157 mg/dL — ABNORMAL HIGH (ref 70–99)
Potassium: 3.1 mmol/L — ABNORMAL LOW (ref 3.5–5.1)
Potassium: 2.5 mmol/L — CL (ref 3.5–5.1)
Potassium: 3.1 mmol/L — ABNORMAL LOW (ref 3.5–5.1)
Potassium: 2.9 mmol/L — ABNORMAL LOW (ref 3.5–5.1)
Sodium: 156 mmol/L — ABNORMAL HIGH (ref 135–145)
Sodium: 154 mmol/L — ABNORMAL HIGH (ref 135–145)
Sodium: 153 mmol/L — ABNORMAL HIGH (ref 135–145)
Sodium: 151 mmol/L — ABNORMAL HIGH (ref 135–145)

## 2018-07-07 LAB — MRSA PCR SCREENING: MRSA by PCR: NEGATIVE

## 2018-07-07 LAB — CBC
HCT: 42.2 % (ref 36.0–46.0)
Hemoglobin: 13 g/dL (ref 12.0–15.0)
MCH: 28.2 pg (ref 26.0–34.0)
MCHC: 30.8 g/dL (ref 30.0–36.0)
MCV: 91.5 fL (ref 80.0–100.0)
Platelets: 177 10*3/uL (ref 150–400)
RBC: 4.61 MIL/uL (ref 3.87–5.11)
RDW: 15.3 % (ref 11.5–15.5)
WBC: 10.6 10*3/uL — ABNORMAL HIGH (ref 4.0–10.5)
nRBC: 0 % (ref 0.0–0.2)

## 2018-07-07 MED ORDER — ZOLEDRONIC ACID 4 MG/5ML IV CONC
4.0000 mg | Freq: Once | INTRAVENOUS | Status: AC
  Administered 2018-07-07: 4 mg via INTRAVENOUS
  Filled 2018-07-07: qty 5

## 2018-07-07 MED ORDER — POTASSIUM CHLORIDE 10 MEQ/100ML IV SOLN
INTRAVENOUS | Status: AC
  Administered 2018-07-07: 10 meq via INTRAVENOUS
  Filled 2018-07-07: qty 100

## 2018-07-07 MED ORDER — POTASSIUM CHLORIDE 2 MEQ/ML IV SOLN
INTRAVENOUS | Status: DC
  Filled 2018-07-07 (×2): qty 1000

## 2018-07-07 MED ORDER — POTASSIUM CHLORIDE 10 MEQ/100ML IV SOLN
10.0000 meq | INTRAVENOUS | Status: AC
  Administered 2018-07-07 (×3): 10 meq via INTRAVENOUS
  Filled 2018-07-07 (×3): qty 100

## 2018-07-07 MED ORDER — POTASSIUM CHLORIDE 10 MEQ/100ML IV SOLN
10.0000 meq | Freq: Once | INTRAVENOUS | Status: AC
  Administered 2018-07-07: 10 meq via INTRAVENOUS
  Filled 2018-07-07: qty 100

## 2018-07-07 MED ORDER — ASPIRIN 300 MG RE SUPP
300.0000 mg | Freq: Every day | RECTAL | Status: DC
  Administered 2018-07-07 – 2018-07-09 (×3): 300 mg via RECTAL
  Filled 2018-07-07 (×3): qty 1

## 2018-07-07 MED ORDER — POTASSIUM CL IN DEXTROSE 5% 20 MEQ/L IV SOLN
20.0000 meq | INTRAVENOUS | Status: DC
  Administered 2018-07-07 – 2018-07-08 (×3): 20 meq via INTRAVENOUS
  Filled 2018-07-07 (×4): qty 1000

## 2018-07-07 NOTE — Progress Notes (Signed)
PROGRESS NOTE   Stacie Bennett  DUK:[id number]    DOB: 06/14/1927    DOA: 07/06/2018  PCP: Nolene Ebbs, MD   I have briefly reviewed patients previous medical records in [facility].  Brief Narrative:  85 year old female with PMH of HTN, HLD, COPD, stroke, depression, bilateral vision loss, PAD, vertigo, stage III chronic kidney disease, thyroid mass, dementia presented with progressive decline over the last 2 to 3 weeks, worsened couple days PTA including poor oral intake, drowsiness, confusion, fall with head injury.  Admitted for hypernatremic dehydration, hypercalcemia, acute on chronic kidney disease and acute metabolic encephalopathy.  Nephrology consulted.   Assessment & Plan:   Principal Problem:   Acute metabolic encephalopathy Active Problems:   COPD (chronic obstructive pulmonary disease) (HCC)   Hypertension   Thyroid mass   Hyperlipidemia   Stroke (HCC)   Hypokalemia   Hypercalcemia   Hypernatremia   Acute renal failure superimposed on stage 3 chronic kidney disease (HCC)   Fall   Dehydration   1. Hypernatremic dehydration: Likely due to hypercalcemia and poor oral intake.  Sodium continue to increase despite D5 half NS and hence changed to IV D5W at 125 mL/h.  Suspect hypernatremia to be subacute or chronic.  Monitor BMP every 6 hourly and aim to correct sodium slowly by no more than 10 M EQ in 24 hours. 2. Hypercalcemia: Calcium >15.  DD: Dehydration, has thyroid mass, PTH 218 on 06/06/2011 suggesting possible hyperparathyroidism, malignancy, doubt milk-alkali versus others.  Continue IV fluids, calcitonin and if does not improve then consider Zometa as discussed with Dr. Augustin Coupe, Nephrology.  Follow PTH, SPEP, PTH RP. 3. Acute renal failure complicating stage III chronic kidney disease: Baseline creatinine <1, presented with creatinine of 2.5 and BUN 22.  Likely due to dehydration and hypercalcemia.  Renal ultrasound without acute findings.  Nephrology input  appreciated, could have dysproteinemia also.  Avoid nephrotoxic's. 4. Hypokalemia: Replace and follow. 5. Acute metabolic encephalopathy: CT head negative for acute findings.  Secondary to multiple metabolic abnormalities complicating possible underlying vascular dementia.  Treat as above and follow closely.NPO until alert and safe to tolerate orally. 6. History of CVA: Holding home atorvastatin and Plavix.  Change aspirin to rectal. 7. Essential hypertension: Mildly uncontrolled.  PRN IV hydralazine. 8. COPD: No clinical bronchospasm.  PRN bronchodilator nebulizations. 9. Hyperlipidemia: Holding Lipitor. 10. Thyroid mass: TSH: 0.659. 11. Mechanical fall: PT and OT evaluation when able. 12. Adult Failure to Thrive: progressively declining over last 6 months and worsening since [facility].  If does not improve then may need to consider palliative care consultation for goals of care.    DVT prophylaxis: Subcutaneous heparin Code Status: DNR Family Communication: Discussed in detail with patient's son, updated care and answered questions. Disposition: To be determined pending clinical improvement.   Consultants:  Nephrology  Procedures:  None  Antimicrobials:  None   Subjective: Drowsy but easily arousable, mumbles incomprehensibly.  Not oriented.  Follows occasional instructions.  As per RN, no acute issues noted.  ROS: As above.  Objective:  Vitals:   07/07/18 0359 07/07/18 0731 07/07/18 0848 07/07/18 1235  BP: (!) 159/83 (!) 158/72  106/74  Pulse: (!) 101 87  87  Resp: (!) 24 (!) 26  (!) 26  Temp: 98.2 F (36.8 C) (!) 97.5 F (36.4 C)  (!) 97.4 F (36.3 C)  TempSrc: Axillary Oral  Oral  SpO2: 96% 96% 97% 97%  Weight:      Height:  Examination:  General exam: Pleasant elderly female, moderately built and thinly nourished, ill looking but in no obvious distress lying comfortably supine in bed.  Oral mucosa dry. Respiratory system: Clear to  auscultation. Respiratory effort normal. Cardiovascular system: S1 & S2 heard, RRR. No JVD, murmurs, rubs, gallops or clicks. No pedal edema.  Telemetry personally reviewed: Sinus rhythm. Gastrointestinal system: Abdomen is nondistended, soft and nontender. No organomegaly or masses felt. Normal bowel sounds heard. Central nervous system: Mental status as noted above. No focal neurological deficits. Extremities: Unable to fully assess power but seems to be moving all limb equally. Skin: No rashes, lesions or ulcers Psychiatry: Judgement and insight impaired. Mood & affect cannot be assessed.     Data Reviewed: I have personally reviewed following labs and imaging studies  CBC: Recent Labs  Lab 07/06/18 1631 07/07/18 0315  WBC 9.2 10.6*  NEUTROABS 7.0  --   HGB 11.3* 13.0  HCT 39.9 42.2  MCV 98.5 91.5  PLT 146* 433   Basic Metabolic Panel: Recent Labs  Lab 07/06/18 1631 07/06/18 2052 07/07/18 0221 07/07/18 0725  NA 153* 153* 156* 154*  K 3.2* 2.7* 3.1* 2.5*  CL 123* 115* 122* 118*  CO2 21* 26 22 27   GLUCOSE 76 104* 150* 191*  BUN 22 25* 27* 26*  CREATININE 2.50* 3.01* 2.95* 2.94*  CALCIUM >15.0* >15.0* >15.0* >15.0*  MG  --  2.5*  --   --    Liver Function Tests: Recent Labs  Lab 07/06/18 1631  AST 39  ALT 16  ALKPHOS 79  BILITOT 2.4*  PROT 5.1*  ALBUMIN 2.5*   CBG: Recent Labs  Lab 07/06/18 2250  GLUCAP 84    Recent Results (from the past 240 hour(s))  SARS Coronavirus 2 (CEPHEID - Performed in [place] hospital lab), Hosp Order     Status: None   Collection Time: 07/06/18  5:15 PM  Result Value Ref Range Status   SARS Coronavirus 2 NEGATIVE NEGATIVE Final    Comment: (NOTE) If result is NEGATIVE SARS-CoV-2 target nucleic acids are NOT DETECTED. The SARS-CoV-2 RNA is generally detectable in upper and lower  respiratory specimens during the acute phase of infection. The lowest  concentration of SARS-CoV-2 viral copies this assay can detect is 250   copies / mL. A negative result does not preclude SARS-CoV-2 infection  and should not be used as the sole basis for treatment or other  patient management decisions.  A negative result may occur with  improper specimen collection / handling, submission of specimen other  than nasopharyngeal swab, presence of viral mutation(s) within the  areas targeted by this assay, and inadequate number of viral copies  (<250 copies / mL). A negative result must be combined with clinical  observations, patient history, and epidemiological information. If result is POSITIVE SARS-CoV-2 target nucleic acids are DETECTED. The SARS-CoV-2 RNA is generally detectable in upper and lower  respiratory specimens dur ing the acute phase of infection.  Positive  results are indicative of active infection with SARS-CoV-2.  Clinical  correlation with patient history and other diagnostic information is  necessary to determine patient infection status.  Positive results do  not rule out bacterial infection or co-infection with other viruses. If result is PRESUMPTIVE POSTIVE SARS-CoV-2 nucleic acids MAY BE PRESENT.   A presumptive positive result was obtained on the submitted specimen  and confirmed on repeat testing.  While 2019 novel coronavirus  (SARS-CoV-2) nucleic acids may be present in the submitted sample  additional confirmatory testing may be necessary for epidemiological  and / or clinical management purposes  to differentiate between  SARS-CoV-2 and other Sarbecovirus currently known to infect humans.  If clinically indicated additional testing with an alternate test  methodology [phone]) is advised. The SARS-CoV-2 RNA is generally  detectable in upper and lower respiratory sp ecimens during the acute  phase of infection. The expected result is Negative. Fact Sheet for Patients:  StrictlyIdeas.no Fact Sheet for Healthcare  Providers: BankingDealers.co.za This test is not yet approved or cleared by the [place] FDA and has been authorized for detection and/or diagnosis of SARS-CoV-2 by FDA under an Emergency Use Authorization (EUA).  This EUA will remain in effect (meaning this test can be used) for the duration of the COVID-19 declaration under Section 564(b)(1) of the Act, 21 U.S.C. section 360bbb-3(b)(1), unless the authorization is terminated or revoked sooner. Performed at [facility], [place] [address], [place], [place] [address]   MRSA PCR Screening     Status: None   Collection Time: 07/06/18 11:37 PM  Result Value Ref Range Status   MRSA by PCR NEGATIVE NEGATIVE Final    Comment:        The GeneXpert MRSA Assay (FDA approved for NASAL specimens only), is one component of a comprehensive MRSA colonization surveillance program. It is not intended to diagnose MRSA infection nor to guide or monitor treatment for MRSA infections. Performed at [facility], [place] [address], [place], [place] [address]          Radiology Studies: Ct Head Wo Contrast  Result Date: 07/06/2018 CLINICAL DATA:  Altered level of consciousness EXAM: CT HEAD WITHOUT CONTRAST TECHNIQUE: Contiguous axial images were obtained from the base of the skull through the vertex without intravenous contrast. COMPARISON:  07/09/2017 FINDINGS: Brain: No evidence of acute infarction, hemorrhage, hydrocephalus, extra-axial collection or mass lesion/mass effect. Bilateral parietooccipital encephalomalacia is noted with significant progression on the left when compared to prior study. There is significant volume loss and extensive chronic microvascular ischemic changes. Vascular: No hyperdense vessel or unexpected calcification. Skull: Normal. Negative for fracture or focal lesion. Sinuses/Orbits: No acute finding. Other: None. IMPRESSION: 1. No acute intracranial abnormality detected. 2. Significant  progression of encephalomalacia in involving the left parietooccipital region. Additional old infarcts are noted above. Electronically Signed   By: Constance Holster M.D.   On: 07/06/2018 19:03   US Renal  Result Date: 07/06/2018 CLINICAL DATA:  Acute kidney injury EXAM: RENAL / URINARY TRACT ULTRASOUND COMPLETE COMPARISON:  None. FINDINGS: Right Kidney: Renal measurements: 8.5 x 3.6 x 3.9 cm = volume: 62 mL . Echogenicity within normal limits. No mass or hydronephrosis visualized. Left Kidney: Renal measurements: 8.9 x 4.7 x 3.7 cm = volume: 79 mL. Echogenicity within normal limits. No mass or hydronephrosis visualized. Bladder: Appears normal for degree of bladder distention. Both ureteral jets were visualized. IMPRESSION: No acute sonographic abnormality. No specific abnormality to explain the patient's acute kidney injury. Electronically Signed   By: Constance Holster M.D.   On: 07/06/2018 21:39   Dg Chest Port 1 View  Result Date: 07/06/2018 CLINICAL DATA:  Weakness and fatigue has been getting severely worse for past 3 weeks EXAM: PORTABLE CHEST - 1 VIEW COMPARISON:  09/25/2014. FINDINGS: Chronic changes of left upper thoracoplasty. Progressive right paratracheal substernal goiter. No pulmonary infiltrate or edema. Heart size normal. No effusion. IMPRESSION: No acute cardiopulmonary disease. Electronically Signed   By: Lucrezia Europe M.D.   On:  07/06/2018 16:55        Scheduled Meds:  calcitonin  4 Units/kg Subcutaneous BID   heparin  5,000 Units Subcutaneous Q8H   Continuous Infusions:  dextrose 5 % with KCl 20 mEq / L 20 mEq (07/07/18 1232)     LOS: 1 day     Vernell Leep, MD, FACP, [facility]. [facility]  To contact the attending provider between 7A-7P or the covering provider during after hours 7P-7A, please log into the web site [email or web address] and access using universal [place] password for that web site. If you do not have the password, please call the hospital  operator.  07/07/2018, 3:25 PM

## 2018-07-07 NOTE — Progress Notes (Signed)
Latest potassium -2.5 and calcium level-15 relayed to MD with order.

## 2018-07-07 NOTE — Progress Notes (Addendum)
[facility] Progress Note    Assessment/ Plan:   85y/o woman HTN CVA 5/19, hyperlipidemia, PAD, thyroid mass, dementia here w/ lethargy found to have hypercalcemia and hypernatremia.   1 AKI volume, plus hypercalcemic nephropathy, could have dysproteinemia also.  Need to tx reversible issues , ie vol, Ca. Not likely primary HPTH with the levels of Ca but poss.  Need cortisol, TSH.  Hold off ACE for now. - Likely has a component of prerenal but may have infiltrative disease as well.  2 ^ Ca doubt milk alkali, suspect malig of some kind.  No evidence med cause - PTHrP, SPEP, PTH requested. Likely etiology is underlying malignancy causing infiltration of the bone. -Has rx 2  doses of Calcitonin (SQ BID); no change in Ca.  - Recheck Ca later today -> if no improvement then will also give a single dose of Zometa 4mg  over 60 min.  3 Hypernatremia - changed to d5w + 14meq KCL @ 162ml/hr. Free water deficit of 2.9L and will treat as chronic. Will check a chemistry panel later this evening; do not want to correct too quickly given risk for cerebral edema. 4. Anemia mild and will be worse with hydration 5. ^ SNa free water losses and inability to concentrate urine with Jamas Lav. 6 Encephalopathy 7 CVA 8 Dementia   P 1/2 Ns, K replete, expand vol, free water slowly.  Tx Ca with calcitonin and vol . If not coming down will give pamidronate in 24h.  Check SPEP, PTh , consider bone scan.  Check TSH , cortisol, give steroids once get labs.  Subjective:   Confused but denies dyspnea.   Objective:   BP (!) 158/72 (BP Location: Right Arm)   Pulse 87   Temp (!) 97.5 F (36.4 C) (Oral)   Resp (!) 26   Ht 5\' 5"  (1.651 m)   Wt 58.8 kg   SpO2 97%   BMI 21.57 kg/m   Intake/Output Summary (Last 24 hours) at 07/07/2018 1048 Last data filed at 07/07/2018 0806 Gross per 24 hour  Intake 1071.31 ml  Output 800 ml  Net 271.31 ml   Weight change:   Physical Exam: GEN: NAD, confused but  pleasant HEENT: Conjunctival pallor, EOMI NECK: Supple, no thyromegaly LUNGS: CTA B/L no rales but poor air movement CV: RRR, No M/R/G ABD: SNDNT +BS  EXT: No lower extremity edema    Imaging: Ct Head Wo Contrast  Result Date: 07/06/2018 CLINICAL DATA:  Altered level of consciousness EXAM: CT HEAD WITHOUT CONTRAST TECHNIQUE: Contiguous axial images were obtained from the base of the skull through the vertex without intravenous contrast. COMPARISON:  07/09/2017 FINDINGS: Brain: No evidence of acute infarction, hemorrhage, hydrocephalus, extra-axial collection or mass lesion/mass effect. Bilateral parietooccipital encephalomalacia is noted with significant progression on the left when compared to prior study. There is significant volume loss and extensive chronic microvascular ischemic changes. Vascular: No hyperdense vessel or unexpected calcification. Skull: Normal. Negative for fracture or focal lesion. Sinuses/Orbits: No acute finding. Other: None. IMPRESSION: 1. No acute intracranial abnormality detected. 2. Significant progression of encephalomalacia in involving the left parietooccipital region. Additional old infarcts are noted above. Electronically Signed   By: Constance Holster M.D.   On: 07/06/2018 19:03   US Renal  Result Date: 07/06/2018 CLINICAL DATA:  Acute kidney injury EXAM: RENAL / URINARY TRACT ULTRASOUND COMPLETE COMPARISON:  None. FINDINGS: Right Kidney: Renal measurements: 8.5 x 3.6 x 3.9 cm = volume: 62 mL . Echogenicity within normal limits. No mass  or hydronephrosis visualized. Left Kidney: Renal measurements: 8.9 x 4.7 x 3.7 cm = volume: 79 mL. Echogenicity within normal limits. No mass or hydronephrosis visualized. Bladder: Appears normal for degree of bladder distention. Both ureteral jets were visualized. IMPRESSION: No acute sonographic abnormality. No specific abnormality to explain the patient's acute kidney injury. Electronically Signed   By: Constance Holster M.D.    On: 07/06/2018 21:39   Dg Chest Port 1 View  Result Date: 07/06/2018 CLINICAL DATA:  Weakness and fatigue has been getting severely worse for past 3 weeks EXAM: PORTABLE CHEST - 1 VIEW COMPARISON:  09/25/2014. FINDINGS: Chronic changes of left upper thoracoplasty. Progressive right paratracheal substernal goiter. No pulmonary infiltrate or edema. Heart size normal. No effusion. IMPRESSION: No acute cardiopulmonary disease. Electronically Signed   By: Lucrezia Europe M.D.   On: 07/06/2018 16:55    Labs: BMET Recent Labs  Lab 07/06/18 1631 07/06/18 2052 07/07/18 0221 07/07/18 0725  NA 153* 153* 156* 154*  K 3.2* 2.7* 3.1* 2.5*  CL 123* 115* 122* 118*  CO2 21* 26 22 27   GLUCOSE 76 104* 150* 191*  BUN 22 25* 27* 26*  CREATININE 2.50* 3.01* 2.95* 2.94*  CALCIUM >15.0* >15.0* >15.0* >15.0*   CBC Recent Labs  Lab 07/06/18 1631 07/07/18 0315  WBC 9.2 10.6*  NEUTROABS 7.0  --   HGB 11.3* 13.0  HCT 39.9 42.2  MCV 98.5 91.5  PLT 146* 177    Medications:    . calcitonin  4 Units/kg Subcutaneous BID  . heparin  5,000 Units Subcutaneous Q8H      Otelia Santee, MD 07/07/2018, 10:48 AM

## 2018-07-07 NOTE — Progress Notes (Signed)
CRITICAL VALUE ALERT  Critical Value: Calcium >15.0  Date & Time Notied:  07/07/2018 0252  Provider Notified: MD Aware  Orders Received/Actions taken: Awaiting

## 2018-07-08 ENCOUNTER — Inpatient Hospital Stay (HOSPITAL_COMMUNITY): Payer: Medicare Other

## 2018-07-08 DIAGNOSIS — E079 Disorder of thyroid, unspecified: Secondary | ICD-10-CM

## 2018-07-08 DIAGNOSIS — R4182 Altered mental status, unspecified: Secondary | ICD-10-CM

## 2018-07-08 DIAGNOSIS — N170 Acute kidney failure with tubular necrosis: Secondary | ICD-10-CM

## 2018-07-08 DIAGNOSIS — J430 Unilateral pulmonary emphysema [MacLeod's syndrome]: Secondary | ICD-10-CM

## 2018-07-08 DIAGNOSIS — E213 Hyperparathyroidism, unspecified: Secondary | ICD-10-CM

## 2018-07-08 LAB — PROTEIN ELECTROPHORESIS, SERUM
A/G Ratio: 0.9 (ref 0.7–1.7)
Albumin ELP: 2.7 g/dL — ABNORMAL LOW (ref 2.9–4.4)
Alpha-1-Globulin: 0.4 g/dL (ref 0.0–0.4)
Alpha-2-Globulin: 1 g/dL (ref 0.4–1.0)
Beta Globulin: 1 g/dL (ref 0.7–1.3)
Gamma Globulin: 0.7 g/dL (ref 0.4–1.8)
Globulin, Total: 3.1 g/dL (ref 2.2–3.9)
Total Protein ELP: 5.8 g/dL — ABNORMAL LOW (ref 6.0–8.5)

## 2018-07-08 LAB — COMPREHENSIVE METABOLIC PANEL
ALT: 30 U/L (ref 0–44)
AST: 38 U/L (ref 15–41)
Albumin: 2.8 g/dL — ABNORMAL LOW (ref 3.5–5.0)
Alkaline Phosphatase: 98 U/L (ref 38–126)
Anion gap: 8 (ref 5–15)
BUN: 24 mg/dL — ABNORMAL HIGH (ref 8–23)
CO2: 25 mmol/L (ref 22–32)
Calcium: >15 mg/dL (ref 8.9–10.3)
Chloride: 116 mmol/L — ABNORMAL HIGH (ref 98–111)
Creatinine, Ser: 2.69 mg/dL — ABNORMAL HIGH (ref 0.44–1.00)
GFR calc Af Amer: 17 mL/min — ABNORMAL LOW (ref >60)
GFR calc non Af Amer: 15 mL/min — ABNORMAL LOW (ref >60)
Glucose, Bld: 154 mg/dL — ABNORMAL HIGH (ref 70–99)
Potassium: 2.9 mmol/L — ABNORMAL LOW (ref 3.5–5.1)
Sodium: 149 mmol/L — ABNORMAL HIGH (ref 135–145)
Total Bilirubin: 1.3 mg/dL — ABNORMAL HIGH (ref 0.3–1.2)
Total Protein: 6.2 g/dL — ABNORMAL LOW (ref 6.5–8.1)

## 2018-07-08 LAB — BASIC METABOLIC PANEL
Anion gap: 11 (ref 5–15)
BUN: 24 mg/dL — ABNORMAL HIGH (ref 8–23)
CO2: 24 mmol/L (ref 22–32)
Calcium: >15 mg/dL (ref 8.9–10.3)
Chloride: 112 mmol/L — ABNORMAL HIGH (ref 98–111)
Creatinine, Ser: 2.69 mg/dL — ABNORMAL HIGH (ref 0.44–1.00)
GFR calc Af Amer: 17 mL/min — ABNORMAL LOW (ref >60)
GFR calc non Af Amer: 15 mL/min — ABNORMAL LOW (ref >60)
Glucose, Bld: 137 mg/dL — ABNORMAL HIGH (ref 70–99)
Potassium: 3.8 mmol/L (ref 3.5–5.1)
Sodium: 147 mmol/L — ABNORMAL HIGH (ref 135–145)

## 2018-07-08 LAB — POTASSIUM: Potassium: 3.5 mmol/L (ref 3.5–5.1)

## 2018-07-08 LAB — PARATHYROID HORMONE, INTACT (NO CA): PTH: 731 pg/mL — ABNORMAL HIGH (ref 15–65)

## 2018-07-08 LAB — SODIUM
Sodium: 146 mmol/L — ABNORMAL HIGH (ref 135–145)
Sodium: 146 mmol/L — ABNORMAL HIGH (ref 135–145)
Sodium: 144 mmol/L (ref 135–145)
Sodium: 143 mmol/L (ref 135–145)

## 2018-07-08 LAB — MAGNESIUM: Magnesium: 1.7 mg/dL (ref 1.7–2.4)

## 2018-07-08 LAB — CALCITRIOL (1,25 DI-OH VIT D): Vit D, 1,25-Dihydroxy: 26.5 pg/mL (ref 19.9–79.3)

## 2018-07-08 MED ORDER — ZOLEDRONIC ACID 4 MG/5ML IV CONC: 4.0000 mg | Freq: Once | INTRAVENOUS | Status: DC

## 2018-07-08 MED ORDER — SODIUM CHLORIDE 0.45 % IV SOLN
INTRAVENOUS | Status: DC
  Administered 2018-07-08 – 2018-07-09 (×4): via INTRAVENOUS

## 2018-07-08 MED ORDER — POTASSIUM CHLORIDE 10 MEQ/100ML IV SOLN
10.0000 meq | Freq: Once | INTRAVENOUS | Status: AC
  Administered 2018-07-08: 12:00:00 10 meq via INTRAVENOUS

## 2018-07-08 MED ORDER — POTASSIUM CHLORIDE 10 MEQ/100ML IV SOLN
10.0000 meq | INTRAVENOUS | Status: AC
  Administered 2018-07-08 (×3): 10 meq via INTRAVENOUS
  Filled 2018-07-08 (×4): qty 100

## 2018-07-08 MED ORDER — METOPROLOL TARTRATE 5 MG/5ML IV SOLN
5.0000 mg | Freq: Three times a day (TID) | INTRAVENOUS | Status: DC
  Administered 2018-07-08 – 2018-07-09 (×4): 5 mg via INTRAVENOUS
  Filled 2018-07-08 (×4): qty 5

## 2018-07-08 NOTE — Progress Notes (Signed)
PROGRESS NOTE    Stacie Bennett  JJK:[id number] DOB: 04-May-1927 DOA: 07/06/2018 PCP: Nolene Ebbs, MD   Brief Narrative:  85 year old BF  PMHx HTN, HLD, COPD, stroke, depression, bilateral vision loss, PAD, vertigo, CKD stage III,  thyroid mass, dementia   Presented with progressive decline over the last 2 to 3 weeks, worsened couple days PTA including poor oral intake, drowsiness, confusion, fall with head injury.  Admitted for hypernatremic dehydration, hypercalcemia, acute on chronic kidney disease and acute metabolic encephalopathy.  Nephrology consulted.      Subjective: A/O x0, minimal response to painful stimuli.      Assessment & Plan:   Principal Problem:   Acute metabolic encephalopathy Active Problems:   COPD (chronic obstructive pulmonary disease) (HCC)   Hypertension   Thyroid mass   Hyperlipidemia   Stroke (HCC)   Hypokalemia   Hypercalcemia   Hypernatremia   Acute renal failure superimposed on stage 3 chronic kidney disease (HCC)   Fall   Dehydration  Hyponatremia dehydration -Likely due to hypercalcemia and poor oral intake - Increase 0.45% saline 296ml/hr.  Sodium improving - Strict in and out - Sodium every 4 hours goal sodium in the next 24 hours 137  Severe Hypercalcemia -Calcium> 15 - Received dose of Zometa on 6/9 discussed case with pharmacy and cannot receive another dose for 7 days -Thyroid mass, diagnosed 06/19/2017 FNA was recommended.  Unable to verify that patient obtained FNA as recommended. -PTH= 218 on 06/06/2011 suggesting possible hyperparathyroidism - 6/8 PTH= 731 - We will repeat thyroid ultrasound and consult Endocrinology considering significant increase PTH in last 12 months. -SPEP, PTH-RP pending  Thyroid mass - TSH WNL.  Suspect mass secondary to parathyroid glands, repeat ultrasound pending  Essential HTN -Metoprolol IV 5 mg q 8 hr  Acute on CKD stage III (baseline Cr<1) - Presented Cr 2.5/BUN 22 -Most likely  dehydration -Monitor closely Recent Labs  Lab 07/07/18 0725 07/07/18 1410 07/07/18 1928 07/08/18 0300 07/08/18 0724  CREATININE 2.94* 2.69* 2.58* 2.69* 2.69*  - No significant improvement in renal function will officially consult nephrology on 6/11  Hypokalemia -Potassium 40 mEq - Recheck K/Mg  Acute metabolic encephalopathy - CT head negative for acute findings - Multifactorial severe hypercalcemia suspect malignancy.  See Hx CVA hypercalcemia, dementia, hypernatremia.  Attempt to correct underlying causes, although if malignancy as I suspect unlikely to be able to correct in this 85 year old.  Hx CVA - All home medications on hold secondary to altered mental status.  Essential HTN  COPD  Failure to thrive  Mechanical fall  Goals of care -Per chart progressively declining over past 6 months worsening since Memorial Day weekend. - Awaiting thyroid ultrasound suspect will show malignancy of parathyroid glands given PTH of 731, at which point discussion will need to be had with POA.  How would you like for [place] to proceed?  Comfort care vs all out cancer work-up     DVT prophylaxis: Subcu heparin Code Status: DNR Family Communication: None Disposition Plan: TBD   Consultants:  Per chart Case discussed with nephrology Dr. Augustin Coupe    Procedures/Significant Events:  06/18/2017 US thyroid:-Thyromegaly with bilateral nodules. - Recommend FNA biopsy of moderately suspicious 3.6 cm inferior left AND 3 cm inferior right nodules. -Recommend 1 year surveillance ultrasound of additional lesions as above.     I have personally reviewed and interpreted all radiology studies and my findings are as above.  VENTILATOR SETTINGS:    Cultures   Antimicrobials: Anti-infectives (From admission, onward)  None       Devices   LINES / TUBES:    Continuous Infusions: . dextrose 5 % with KCl 20 mEq / L 20 mEq (07/08/18 0511)  . potassium chloride 10 mEq (07/08/18  0628)     Objective: Vitals:   07/07/18 2300 07/08/18 0300 07/08/18 0328 07/08/18 0715  BP: (!) 161/70 (!) 142/83 (!) 142/83 137/88  Pulse: 94 88 88 93  Resp: (!) 22 (!) 25 (!) 22 (!) 22  Temp: 97.6 F (36.4 C) 97.6 F (36.4 C)  (!) 97.4 F (36.3 C)  TempSrc: Oral Oral  Oral  SpO2: 96% 97% 97% 98%  Weight:      Height:        Intake/Output Summary (Last 24 hours) at 07/08/2018 0741 Last data filed at 07/08/2018 8841 Gross per 24 hour  Intake 2770.08 ml  Output 2200 ml  Net 570.08 ml   Filed Weights   07/06/18 2245  Weight: 58.8 kg    Examination:  General: A/O x0, no acute respiratory distress Eyes: negative scleral hemorrhage, negative anisocoria, negative icterus ENT: Negative Runny nose, negative gingival bleeding, Neck:  Negative scars, masses, torticollis, lymphadenopathy, JVD Lungs: Clear to auscultation bilaterally without wheezes or crackles Cardiovascular: Regular rate and rhythm without murmur gallop or rub normal S1 and S2 Abdomen: negative abdominal pain, nondistended, positive soft, bowel sounds, no rebound, no ascites, no appreciable mass Extremities: No significant cyanosis, clubbing, or edema bilateral lower extremities Skin: Negative rashes, lesions, ulcers Psychiatric: Unable to evaluate secondary to altered mental status  Central nervous system: Minimal response to painful stimuli    .     Data Reviewed: Care during the described time interval was provided by me .  I have reviewed this patient's available data, including medical history, events of note, physical examination, and all test results as part of my evaluation.   CBC: Recent Labs  Lab 07/06/18 1631 07/07/18 0315  WBC 9.2 10.6*  NEUTROABS 7.0  --   HGB 11.3* 13.0  HCT 39.9 42.2  MCV 98.5 91.5  PLT 146* 660   Basic Metabolic Panel: Recent Labs  Lab 07/06/18 2052 07/07/18 0221 07/07/18 0725 07/07/18 1410 07/07/18 1928 07/08/18 0300  NA 153* 156* 154* 153* 151* 149*  K  2.7* 3.1* 2.5* 3.1* 2.9* 2.9*  CL 115* 122* 118* 122* 119* 116*  CO2 26 22 27 23 25 25   GLUCOSE 104* 150* 191* 156* 157* 154*  BUN 25* 27* 26* 25* 25* 24*  CREATININE 3.01* 2.95* 2.94* 2.69* 2.58* 2.69*  CALCIUM >15.0* >15.0* >15.0* >15.0* >15.0* >15.0*  MG 2.5*  --   --   --   --   --    GFR: Estimated Creatinine Clearance: 12.8 mL/min (A) (by C-G formula based on SCr of 2.69 mg/dL (H)). Liver Function Tests: Recent Labs  Lab 07/06/18 1631 07/08/18 0300  AST 39 38  ALT 16 30  ALKPHOS 79 98  BILITOT 2.4* 1.3*  PROT 5.1* 6.2*  ALBUMIN 2.5* 2.8*   No results for input(s): LIPASE, AMYLASE in the last 168 hours. No results for input(s): AMMONIA in the last 168 hours. Coagulation Profile: No results for input(s): INR, PROTIME in the last 168 hours. Cardiac Enzymes: No results for input(s): CKTOTAL, CKMB, CKMBINDEX, TROPONINI in the last 168 hours. BNP (last 3 results) No results for input(s): PROBNP in the last 8760 hours. HbA1C: No results for input(s): HGBA1C in the last 72 hours. CBG: Recent Labs  Lab 07/06/18 2250  GLUCAP  84   Lipid Profile: No results for input(s): CHOL, HDL, LDLCALC, TRIG, CHOLHDL, LDLDIRECT in the last 72 hours. Thyroid Function Tests: Recent Labs    07/06/18 2052  TSH 0.659   Anemia Panel: No results for input(s): VITAMINB12, FOLATE, FERRITIN, TIBC, IRON, RETICCTPCT in the last 72 hours. Urine analysis:    Component Value Date/Time   COLORURINE YELLOW 07/06/2018 1824   APPEARANCEUR CLEAR 07/06/2018 1824   LABSPEC 1.008 07/06/2018 1824   PHURINE 6.0 07/06/2018 1824   GLUCOSEU NEGATIVE 07/06/2018 1824   HGBUR LARGE (A) 07/06/2018 1824   BILIRUBINUR NEGATIVE 07/06/2018 1824   KETONESUR NEGATIVE 07/06/2018 1824   PROTEINUR 30 (A) 07/06/2018 1824   NITRITE NEGATIVE 07/06/2018 1824   LEUKOCYTESUR NEGATIVE 07/06/2018 1824   Sepsis Labs: @LABRCNTIP (procalcitonin:4,lacticidven:4)  ) Recent Results (from the past 240 hour(s))  SARS  Coronavirus 2 (CEPHEID - Performed in [place] hospital lab), Hosp Order     Status: None   Collection Time: 07/06/18  5:15 PM  Result Value Ref Range Status   SARS Coronavirus 2 NEGATIVE NEGATIVE Final    Comment: (NOTE) If result is NEGATIVE SARS-CoV-2 target nucleic acids are NOT DETECTED. The SARS-CoV-2 RNA is generally detectable in upper and lower  respiratory specimens during the acute phase of infection. The lowest  concentration of SARS-CoV-2 viral copies this assay can detect is 250  copies / mL. A negative result does not preclude SARS-CoV-2 infection  and should not be used as the sole basis for treatment or other  patient management decisions.  A negative result may occur with  improper specimen collection / handling, submission of specimen other  than nasopharyngeal swab, presence of viral mutation(s) within the  areas targeted by this assay, and inadequate number of viral copies  (<250 copies / mL). A negative result must be combined with clinical  observations, patient history, and epidemiological information. If result is POSITIVE SARS-CoV-2 target nucleic acids are DETECTED. The SARS-CoV-2 RNA is generally detectable in upper and lower  respiratory specimens dur ing the acute phase of infection.  Positive  results are indicative of active infection with SARS-CoV-2.  Clinical  correlation with patient history and other diagnostic information is  necessary to determine patient infection status.  Positive results do  not rule out bacterial infection or co-infection with other viruses. If result is PRESUMPTIVE POSTIVE SARS-CoV-2 nucleic acids MAY BE PRESENT.   A presumptive positive result was obtained on the submitted specimen  and confirmed on repeat testing.  While 2019 novel coronavirus  (SARS-CoV-2) nucleic acids may be present in the submitted sample  additional confirmatory testing may be necessary for epidemiological  and / or clinical management purposes  to  differentiate between  SARS-CoV-2 and other Sarbecovirus currently known to infect humans.  If clinically indicated additional testing with an alternate test  methodology [phone]) is advised. The SARS-CoV-2 RNA is generally  detectable in upper and lower respiratory sp ecimens during the acute  phase of infection. The expected result is Negative. Fact Sheet for Patients:  StrictlyIdeas.no Fact Sheet for Healthcare Providers: BankingDealers.co.za This test is not yet approved or cleared by the [place] FDA and has been authorized for detection and/or diagnosis of SARS-CoV-2 by FDA under an Emergency Use Authorization (EUA).  This EUA will remain in effect (meaning this test can be used) for the duration of the COVID-19 declaration under Section 564(b)(1) of the Act, 21 U.S.C. section 360bbb-3(b)(1), unless the authorization is terminated or revoked sooner. Performed at [facility], [place] [address] [place], [place] [address]   MRSA PCR Screening     Status: None   Collection Time: 07/06/18 11:37 PM  Result Value Ref Range Status   MRSA by PCR NEGATIVE NEGATIVE Final    Comment:        The GeneXpert MRSA Assay (FDA approved for NASAL specimens only), is one component of a comprehensive MRSA colonization surveillance program. It is not intended to diagnose MRSA infection nor to guide or monitor treatment for MRSA infections. Performed at [facility], [place] [address] [place],  [address]          Radiology Studies: Ct Head Wo Contrast  Result Date: 07/06/2018 CLINICAL DATA:  Altered level of consciousness EXAM: CT HEAD WITHOUT CONTRAST TECHNIQUE: Contiguous axial images were obtained from the base of the skull through the vertex without intravenous contrast. COMPARISON:  07/09/2017 FINDINGS: Brain: No evidence of acute infarction, hemorrhage, hydrocephalus, extra-axial collection or mass lesion/mass  effect. Bilateral parietooccipital encephalomalacia is noted with significant progression on the left when compared to prior study. There is significant volume loss and extensive chronic microvascular ischemic changes. Vascular: No hyperdense vessel or unexpected calcification. Skull: Normal. Negative for fracture or focal lesion. Sinuses/Orbits: No acute finding. Other: None. IMPRESSION: 1. No acute intracranial abnormality detected. 2. Significant progression of encephalomalacia in involving the left parietooccipital region. Additional old infarcts are noted above. Electronically Signed   By: Constance Holster M.D.   On: 07/06/2018 19:03   US Renal  Result Date: 07/06/2018 CLINICAL DATA:  Acute kidney injury EXAM: RENAL / URINARY TRACT ULTRASOUND COMPLETE COMPARISON:  None. FINDINGS: Right Kidney: Renal measurements: 8.5 x 3.6 x 3.9 cm = volume: 62 mL . Echogenicity within normal limits. No mass or hydronephrosis visualized. Left Kidney: Renal measurements: 8.9 x 4.7 x 3.7 cm = volume: 79 mL. Echogenicity within normal limits. No mass or hydronephrosis visualized. Bladder: Appears normal for degree of bladder distention. Both ureteral jets were visualized. IMPRESSION: No acute sonographic abnormality. No specific abnormality to explain the patient's acute kidney injury. Electronically Signed   By: Constance Holster M.D.   On: 07/06/2018 21:39   Dg Chest Port 1 View  Result Date: 07/06/2018 CLINICAL DATA:  Weakness and fatigue has been getting severely worse for past 3 weeks EXAM: PORTABLE CHEST - 1 VIEW COMPARISON:  09/25/2014. FINDINGS: Chronic changes of left upper thoracoplasty. Progressive right paratracheal substernal goiter. No pulmonary infiltrate or edema. Heart size normal. No effusion. IMPRESSION: No acute cardiopulmonary disease. Electronically Signed   By: Lucrezia Europe M.D.   On: 07/06/2018 16:55        Scheduled Meds: . aspirin  300 mg Rectal Daily  . calcitonin  4 Units/kg Subcutaneous  BID  . heparin  5,000 Units Subcutaneous Q8H   Continuous Infusions: . dextrose 5 % with KCl 20 mEq / L 20 mEq (07/08/18 0511)  . potassium chloride 10 mEq (07/08/18 0628)     LOS: 2 days   The patient is critically ill with multiple organ systems failure and requires high complexity decision making for assessment and support, frequent evaluation and titration of therapies, application of advanced monitoring technologies and extensive interpretation of multiple databases. Critical Care Time devoted to patient care services described in this note  Time spent: 40 minutes     Trevante Tennell, Geraldo Docker, MD [facility] Pager [phone]  If 7PM-7AM, please contact night-coverage [email or web address] Password [facility] 07/08/2018, 7:41 AM

## 2018-07-08 NOTE — Progress Notes (Signed)
Paged Schorr regarding patient status change.  Patient is increasingly lethargic and minimally responsive. Day shift reported that she was also lethargic and minimally responsive as well.  Patient's respirations have increased within the past hour from 18 at last vital check to ranging in the 30's.  Patient continues keep O2 sats of 95-96% on room air. HOB elevated to 30 degrees, oral care provided, and mouth suctioned.    Discussed putting in a palliative consult with Schorr regarding patient's current status and trends.  Will continue to closely monitor the patient and keep her comfortable.

## 2018-07-08 NOTE — Progress Notes (Addendum)
[facility] Progress Note    Assessment/ Plan:   85y/o woman HTN CVA 5/19, hyperlipidemia, PAD, thyroid mass, dementia here w/ lethargy found to have hypercalcemia and hypernatremia. Cr was 0.84 07/09/2017  1AKI volume, plus hypercalcemic nephropathy, could have dysproteinemia also. Need to tx reversible issues , ie vol, Ca. Not likely primary HPTH with the levels of Ca but poss. Need cortisol, TSH. Hold off ACE for now. NL TSH and cortisol. - Likely has a component of prerenal but may have infiltrative disease as well.  2^ Ca doubt milk alkali, suspect malig of some kind. No evidence med cause  - PTHrP, SPEP requested pending, PTH (731).   PTH should be low with hypercalcemia which suggests  etiology may be primary parathyroid with infiltration bone.  - She is on Calcitonin (SQ BID); no change in Ca.  - Started Zometa 4mg  over 60 min on 6/9 as well.  - Needs malignancy w/u; consider bone scan.  - Will change IVF to 1/2 NS @150   3 Hypernatremia - changed to d5w + 62meq KCL @ 126ml/hr. Free water deficit initially of 2.9L and being  treated as chronic. Improving at an adequate rate. 4. Anemiamild and will be worse with hydration 5.^ SNa free water losses and inability to concentrate urine with Jamas Lav. 6 Encephalopathy 7 CVA 8 Dementia    Subjective:   Confused, lethargic.   Objective:   BP 137/88 (BP Location: Right Arm)   Pulse 93   Temp (!) 97.4 F (36.3 C) (Oral)   Resp (!) 22   Ht 5\' 5"  (1.651 m)   Wt 58.8 kg   SpO2 98%   BMI 21.57 kg/m   Intake/Output Summary (Last 24 hours) at 07/08/2018 1011 Last data filed at 07/08/2018 8563 Gross per 24 hour  Intake 2320.08 ml  Output 2200 ml  Net 120.08 ml   Weight change:   Physical Exam: GEN: NAD, confused HEENT: Conjunctival pallor, EOMI NECK: Supple, no thyromegaly LUNGS: CTA B/L no rales but poor air movement CV: RRR, No M/R/G ABD: SNDNT +BS  EXT: No lower extremity  edema  Imaging: Ct Head Wo Contrast  Result Date: 07/06/2018 CLINICAL DATA:  Altered level of consciousness EXAM: CT HEAD WITHOUT CONTRAST TECHNIQUE: Contiguous axial images were obtained from the base of the skull through the vertex without intravenous contrast. COMPARISON:  07/09/2017 FINDINGS: Brain: No evidence of acute infarction, hemorrhage, hydrocephalus, extra-axial collection or mass lesion/mass effect. Bilateral parietooccipital encephalomalacia is noted with significant progression on the left when compared to prior study. There is significant volume loss and extensive chronic microvascular ischemic changes. Vascular: No hyperdense vessel or unexpected calcification. Skull: Normal. Negative for fracture or focal lesion. Sinuses/Orbits: No acute finding. Other: None. IMPRESSION: 1. No acute intracranial abnormality detected. 2. Significant progression of encephalomalacia in involving the left parietooccipital region. Additional old infarcts are noted above. Electronically Signed   By: Constance Holster M.D.   On: 07/06/2018 19:03   US Renal  Result Date: 07/06/2018 CLINICAL DATA:  Acute kidney injury EXAM: RENAL / URINARY TRACT ULTRASOUND COMPLETE COMPARISON:  None. FINDINGS: Right Kidney: Renal measurements: 8.5 x 3.6 x 3.9 cm = volume: 62 mL . Echogenicity within normal limits. No mass or hydronephrosis visualized. Left Kidney: Renal measurements: 8.9 x 4.7 x 3.7 cm = volume: 79 mL. Echogenicity within normal limits. No mass or hydronephrosis visualized. Bladder: Appears normal for degree of bladder distention. Both ureteral jets were visualized. IMPRESSION: No acute sonographic abnormality. No specific abnormality to explain  the patient's acute kidney injury. Electronically Signed   By: Constance Holster M.D.   On: 07/06/2018 21:39   Dg Chest Port 1 View  Result Date: 07/06/2018 CLINICAL DATA:  Weakness and fatigue has been getting severely worse for past 3 weeks EXAM: PORTABLE CHEST - 1  VIEW COMPARISON:  09/25/2014. FINDINGS: Chronic changes of left upper thoracoplasty. Progressive right paratracheal substernal goiter. No pulmonary infiltrate or edema. Heart size normal. No effusion. IMPRESSION: No acute cardiopulmonary disease. Electronically Signed   By: Lucrezia Europe M.D.   On: 07/06/2018 16:55    Labs: BMET Recent Labs  Lab 07/06/18 2052 07/07/18 0221 07/07/18 0725 07/07/18 1410 07/07/18 1928 07/08/18 0300 07/08/18 0724  NA 153* 156* 154* 153* 151* 149* 147*  K 2.7* 3.1* 2.5* 3.1* 2.9* 2.9* 3.8  CL 115* 122* 118* 122* 119* 116* 112*  CO2 26 22 27 23 25 25 24   GLUCOSE 104* 150* 191* 156* 157* 154* 137*  BUN 25* 27* 26* 25* 25* 24* 24*  CREATININE 3.01* 2.95* 2.94* 2.69* 2.58* 2.69* 2.69*  CALCIUM >15.0* >15.0* >15.0* >15.0* >15.0* >15.0* >15.0*   CBC Recent Labs  Lab 07/06/18 1631 07/07/18 0315  WBC 9.2 10.6*  NEUTROABS 7.0  --   HGB 11.3* 13.0  HCT 39.9 42.2  MCV 98.5 91.5  PLT 146* 177    Medications:    . aspirin  300 mg Rectal Daily  . calcitonin  4 Units/kg Subcutaneous BID  . heparin  5,000 Units Subcutaneous Q8H      Otelia Santee, MD 07/08/2018, 10:11 AM

## 2018-07-08 NOTE — Progress Notes (Signed)
CRITICAL VALUE ALERT  Critical Value:  Ca  >15 Date & Time Notied: 0845  Provider Notified: yes  Orders Received/Actions taken:

## 2018-07-09 ENCOUNTER — Inpatient Hospital Stay (HOSPITAL_COMMUNITY): Payer: Medicare Other

## 2018-07-09 DIAGNOSIS — E860 Dehydration: Secondary | ICD-10-CM

## 2018-07-09 DIAGNOSIS — Z515 Encounter for palliative care: Secondary | ICD-10-CM

## 2018-07-09 DIAGNOSIS — I10 Essential (primary) hypertension: Secondary | ICD-10-CM

## 2018-07-09 DIAGNOSIS — W19XXXA Unspecified fall, initial encounter: Secondary | ICD-10-CM

## 2018-07-09 DIAGNOSIS — M7989 Other specified soft tissue disorders: Secondary | ICD-10-CM

## 2018-07-09 DIAGNOSIS — E079 Disorder of thyroid, unspecified: Secondary | ICD-10-CM

## 2018-07-09 DIAGNOSIS — J430 Unilateral pulmonary emphysema [MacLeod's syndrome]: Secondary | ICD-10-CM

## 2018-07-09 DIAGNOSIS — E213 Hyperparathyroidism, unspecified: Secondary | ICD-10-CM

## 2018-07-09 DIAGNOSIS — E870 Hyperosmolality and hypernatremia: Secondary | ICD-10-CM

## 2018-07-09 DIAGNOSIS — N170 Acute kidney failure with tubular necrosis: Secondary | ICD-10-CM

## 2018-07-09 DIAGNOSIS — E876 Hypokalemia: Secondary | ICD-10-CM

## 2018-07-09 DIAGNOSIS — N183 Chronic kidney disease, stage 3 (moderate): Secondary | ICD-10-CM

## 2018-07-09 DIAGNOSIS — E7800 Pure hypercholesterolemia, unspecified: Secondary | ICD-10-CM

## 2018-07-09 DIAGNOSIS — G9341 Metabolic encephalopathy: Secondary | ICD-10-CM

## 2018-07-09 DIAGNOSIS — Z7189 Other specified counseling: Secondary | ICD-10-CM

## 2018-07-09 LAB — CBC
HCT: 35.5 % — ABNORMAL LOW (ref 36.0–46.0)
Hemoglobin: 10.9 g/dL — ABNORMAL LOW (ref 12.0–15.0)
MCH: 27.9 pg (ref 26.0–34.0)
MCHC: 30.7 g/dL (ref 30.0–36.0)
MCV: 91 fL (ref 80.0–100.0)
Platelets: 169 10*3/uL (ref 150–400)
RBC: 3.9 MIL/uL (ref 3.87–5.11)
RDW: 15.2 % (ref 11.5–15.5)
WBC: 13.7 10*3/uL — ABNORMAL HIGH (ref 4.0–10.5)
nRBC: 0 % (ref 0.0–0.2)

## 2018-07-09 LAB — IMMUNOFIXATION ELECTROPHORESIS
IgA: 319 mg/dL (ref 64–422)
IgG (Immunoglobin G), Serum: 931 mg/dL (ref 586–1602)
IgM (Immunoglobulin M), Srm: 32 mg/dL (ref 26–217)
Total Protein ELP: 6.3 g/dL (ref 6.0–8.5)

## 2018-07-09 LAB — BASIC METABOLIC PANEL WITH GFR
Anion gap: 8 (ref 5–15)
BUN: 36 mg/dL — ABNORMAL HIGH (ref 8–23)
CO2: 21 mmol/L — ABNORMAL LOW (ref 22–32)
Calcium: 14.2 mg/dL (ref 8.9–10.3)
Chloride: 111 mmol/L (ref 98–111)
Creatinine, Ser: 3.47 mg/dL — ABNORMAL HIGH (ref 0.44–1.00)
GFR calc Af Amer: 13 mL/min — ABNORMAL LOW
GFR calc non Af Amer: 11 mL/min — ABNORMAL LOW
Glucose, Bld: 90 mg/dL (ref 70–99)
Potassium: 3.4 mmol/L — ABNORMAL LOW (ref 3.5–5.1)
Sodium: 140 mmol/L (ref 135–145)

## 2018-07-09 LAB — SODIUM
Sodium: 142 mmol/L (ref 135–145)
Sodium: 140 mmol/L (ref 135–145)

## 2018-07-09 LAB — MAGNESIUM: Magnesium: 1.6 mg/dL — ABNORMAL LOW (ref 1.7–2.4)

## 2018-07-09 MED ORDER — GLYCOPYRROLATE 1 MG PO TABS
1.0000 mg | ORAL_TABLET | ORAL | Status: DC | PRN
  Filled 2018-07-09: qty 1

## 2018-07-09 MED ORDER — GLYCOPYRROLATE 0.2 MG/ML IJ SOLN: 0.2000 mg | INTRAMUSCULAR | Status: DC | PRN

## 2018-07-09 MED ORDER — LORAZEPAM 2 MG/ML PO CONC: 1.0000 mg | ORAL | Status: DC | PRN

## 2018-07-09 MED ORDER — LORAZEPAM 1 MG PO TABS: 1.0000 mg | ORAL_TABLET | ORAL | Status: DC | PRN

## 2018-07-09 MED ORDER — HALOPERIDOL LACTATE 2 MG/ML PO CONC
0.5000 mg | ORAL | Status: DC | PRN
  Filled 2018-07-09: qty 0.3

## 2018-07-09 MED ORDER — HALOPERIDOL 0.5 MG PO TABS
0.5000 mg | ORAL_TABLET | ORAL | Status: DC | PRN
  Filled 2018-07-09: qty 1

## 2018-07-09 MED ORDER — DEXTROSE 5 % IV SOLN
INTRAVENOUS | Status: DC
  Administered 2018-07-09: 09:00:00 via INTRAVENOUS

## 2018-07-09 MED ORDER — LORAZEPAM 2 MG/ML IJ SOLN
1.0000 mg | INTRAMUSCULAR | Status: DC | PRN
  Administered 2018-07-09: 1 mg via INTRAVENOUS
  Filled 2018-07-09: qty 1

## 2018-07-09 MED ORDER — HYDROMORPHONE HCL 1 MG/ML IJ SOLN
0.5000 mg | INTRAMUSCULAR | Status: DC | PRN
  Filled 2018-07-09: qty 0.5

## 2018-07-09 MED ORDER — HALOPERIDOL LACTATE 5 MG/ML IJ SOLN: 0.5000 mg | INTRAMUSCULAR | Status: DC | PRN

## 2018-07-09 NOTE — Care Management Important Message (Signed)
Important Message  Patient Details  Name: Stacie Bennett MRN: [id number] Date of Birth: 1927/09/12   Medicare Important Message Given:  Yes    Memory Argue 07/09/2018, 1:26 PM

## 2018-07-09 NOTE — Progress Notes (Signed)
Used Dept tablet to video call son Georgiana Shore through [facility] to be able to see patient. Patient semi-responsive. Pt moved head toward sons voice on the call also attempted to open eyes and moved lips during video call.

## 2018-07-09 NOTE — Progress Notes (Signed)
[facility] Progress Note    Assessment/ Plan:   85y/o woman HTN CVA 5/19, hyperlipidemia, PAD, thyroid mass, dementia here w/ lethargy found to have hypercalcemia and hypernatremia.Cr was 0.84 07/09/2017  1AKI volume, plus hypercalcemic nephropathy, could have dysproteinemia also. Need to tx reversible issues , ie vol, Ca. Not likely primary HPTHwith the levels of [place] poss. Need cortisol, TSH. Hold off ACE for now. NL TSH and cortisol. - Likely has a component of prerenal but may have infiltrative disease as well. 2^ Ca doubt milk alkali, suspect malig of some kind. No evidence med cause  - PTHrP P, SPEP neg for monoclonal spike, PTH (731).   PTH should be low with hypercalcemia which suggests  etiology may be primary parathyroid with infiltration of the bone.  - She is on Calcitonin (SQ BID); no change in Ca. Pts often stop responding to Calcitonin within 48 hrs due to tachyphylaxis.  -Started Zometa 4mg  over 60 min on 6/9 as well.  - Needs malignancy w/u; consider bone scan. Likely continued and severe breakdown of bone causing this high of a Ca level.  - Will stop the 1/2 NS; and start D5W@50 .  - Agree w/ palliative.  3Hypernatremia - Improved. 4. Anemiamild and will be worse with hydration 5.^ SNa free water losses and inability to concentrate urine with Jamas Lav. 6 Encephalopathy 7 CVA 8 Dementia  Subjective:   Confused, lethargic. Arousable somewhat to painful stimuli. More work of breathing today   Objective:   BP 116/60 (BP Location: Left Arm)   Pulse 84   Temp 98.1 F (36.7 C) (Axillary)   Resp (!) 21   Ht 5\' 5"  (1.651 m)   Wt 63.1 kg   SpO2 96%   BMI 23.15 kg/m   Intake/Output Summary (Last 24 hours) at 07/09/2018 0724 Last data filed at 07/09/2018 0640 Gross per 24 hour  Intake 2809.32 ml  Output 550 ml  Net 2259.32 ml   Weight change:   Physical Exam: GEN: Lethargic NECK: Supple, no thyromegaly LUNGS: Poor air  movement, incr RR CV: RRR, No M/R/G ABD: SNDNT +BS  EXT: Trace lower extremity edema  Imaging: No results found.  Labs: BMET Recent Labs  Lab 07/06/18 2052 07/07/18 0221 07/07/18 0725 07/07/18 1410 07/07/18 1928 07/08/18 0300 07/08/18 0724 07/08/18 1125 07/08/18 1129 07/08/18 1540 07/08/18 1956 07/08/18 2302  NA 153* 156* 154* 153* 151* 149* 147*  --  146* 146* 144 143  K 2.7* 3.1* 2.5* 3.1* 2.9* 2.9* 3.8 3.5  --   --   --   --   CL 115* 122* 118* 122* 119* 116* 112*  --   --   --   --   --   CO2 26 22 27 23 25 25 24   --   --   --   --   --   GLUCOSE 104* 150* 191* 156* 157* 154* 137*  --   --   --   --   --   BUN 25* 27* 26* 25* 25* 24* 24*  --   --   --   --   --   CREATININE 3.01* 2.95* 2.94* 2.69* 2.58* 2.69* 2.69*  --   --   --   --   --   CALCIUM >15.0* >15.0* >15.0* >15.0* >15.0* >15.0* >15.0*  --   --   --   --   --    CBC Recent Labs  Lab 07/06/18 1631 07/07/18 0315  WBC 9.2 10.6*  NEUTROABS 7.0  --   HGB 11.3* 13.0  HCT 39.9 42.2  MCV 98.5 91.5  PLT 146* 177    Medications:    . aspirin  300 mg Rectal Daily  . heparin  5,000 Units Subcutaneous Q8H  . metoprolol tartrate  5 mg Intravenous Q8H      Otelia Santee, MD 07/09/2018, 7:24 AM

## 2018-07-09 NOTE — Progress Notes (Addendum)
PROGRESS NOTE    Stacie Bennett  VOH:[id number] DOB: [birth year] DOA: 07/06/2018 PCP: Nolene Ebbs, MD   Brief Narrative:  85 year old BF  PMHx HTN, HLD, COPD, stroke, depression, bilateral vision loss, PAD, vertigo, CKD stage III,  thyroid mass, dementia   Presented with progressive decline over the last 2 to 3 weeks, worsened couple days PTA including poor oral intake, drowsiness, confusion, fall with head injury.  Admitted for hypernatremic dehydration, hypercalcemia, acute on chronic kidney disease and acute metabolic encephalopathy.  Nephrology consulted.      Subjective: 6/11 A/O x0, minimally responsive to painful stimuli    Assessment & Plan:   Principal Problem:   Acute metabolic encephalopathy Active Problems:   COPD (chronic obstructive pulmonary disease) (HCC)   Hypertension   Thyroid mass   Hyperlipidemia   Stroke (HCC)   Hypokalemia   Hypercalcemia   Hypernatremia   Acute renal failure superimposed on stage 3 chronic kidney disease ([place])   Fall   Dehydration  Hyponatremia dehydration -Likely due to hypercalcemia and poor oral intake - Increase 0.45% saline 26ml/hr.  Sodium improving - Strict in and out - Sodium every 4 hours goal sodium in the next 24 hours 137  Severe Hypercalcemia -Calcium> 15 - Received dose of Zometa on 6/9 discussed case with pharmacy and cannot receive another dose for 7 days -Thyroid mass, diagnosed 06/19/2017 FNA was recommended.  Unable to verify that patient obtained FNA as recommended. -PTH= 218 on 06/06/2011 suggesting possible hyperparathyroidism - 6/8 PTH= 731 - We will repeat thyroid ultrasound and consult Endocrinology considering significant increase PTH in last 12 months. -SPEP, PTH-RP pending - Discussed case with Dr. Aletta Edouard, IR and they will biopsy thyroid gland R/O neoplasm.  Thyroid mass - TSH WNL.  Suspect mass secondary to parathyroid glands, repeat ultrasound pending  Essential HTN -Metoprolol IV 5 mg  q 8 hr  Acute on CKD stage III (baseline Cr<1) - Presented Cr 2.5/BUN 22 -Most likely dehydration -Monitor closely Recent Labs  Lab 07/07/18 0725 07/07/18 1410 07/07/18 1928 07/08/18 0300 07/08/18 0724  CREATININE 2.94* 2.69* 2.58* 2.69* 2.69*  - No significant improvement in renal function will officially consult nephrology on 6/11  Hypokalemia -Potassium 40 mEq - Recheck K/Mg  Acute metabolic encephalopathy - CT head negative for acute findings - Multifactorial severe hypercalcemia suspect malignancy.  See Hx CVA hypercalcemia, dementia, hypernatremia.  Attempt to correct underlying causes, although if malignancy as I suspect unlikely to be able to correct in this 85 year old.  Hx CVA - All home medications on hold secondary to altered mental status.  Essential HTN -Controlled without medication.  COPD -Titrate O2 to maintain comfort  Failure to thrive -Transition to hospice  Mechanical fall   Goals of care -Per chart progressively declining over past 6 months worsening since Memorial Day weekend. - Awaiting thyroid ultrasound suspect will show malignancy of parathyroid glands given PTH of 731, at which point discussion will need to be had with POA.  How would you like for [place] to proceed?  Comfort care vs all out cancer work-up -ADDENDUM 6/11 spoke with son Mr [name] and reviewed in detail mothers prognosis.  Stated he thought he was going to transition to comfort care but wanted to first speak to palliative care.  Received a phone call later in the evening from palliative care that they had spoken with son and he indeed had decided to transition to hospice care.  On 6/12 we will begin process of attempting to transfer patient to [facility]      DVT prophylaxis: Subcu heparin Code Status: DNR Family Communication: None Disposition Plan: TBD   Consultants:  Per chart Case discussed with nephrology Dr. Augustin Coupe    Procedures/Significant Events:  06/18/2017 US  thyroid:-Thyromegaly with bilateral nodules. - Recommend FNA biopsy of moderately suspicious 3.6 cm inferior left AND 3 cm inferior right nodules. -Recommend 1 year surveillance ultrasound of additional lesions as above.     I have personally reviewed and interpreted all radiology studies and my findings are as above.  VENTILATOR SETTINGS:    Cultures   Antimicrobials: Anti-infectives (From admission, onward)   None       Devices   LINES / TUBES:    Continuous Infusions:  dextrose 50 mL/hr at 07/09/18 0907     Objective: Vitals:   07/09/18 0509 07/09/18 0730 07/09/18 1053 07/09/18 1210  BP: 116/60 107/71  99/69  Pulse: 84 77  83  Resp: (!) 21 18  (!) 24  Temp:  98.7 F (37.1 C) 98.7 F (37.1 C) 98.6 F (37 C)  TempSrc:  Axillary Axillary Axillary  SpO2: 96% 95%  97%  Weight:      Height:        Intake/Output Summary (Last 24 hours) at 07/09/2018 1216 Last data filed at 07/09/2018 0640 Gross per 24 hour  Intake 2809.32 ml  Output 550 ml  Net 2259.32 ml   Filed Weights   07/06/18 2245 07/08/18 1126 07/09/18 0347  Weight: 58.8 kg 62 kg 63.1 kg    Physical Exam:  General: A/O x0 no acute respiratory distress Eyes: negative scleral hemorrhage, negative anisocoria, negative icterus ENT: Negative Runny nose, negative gingival bleeding, Neck:  Negative scars, masses, torticollis, lymphadenopathy, JVD Lungs: Clear to auscultation bilaterally without wheezes or crackles Cardiovascular: Regular rate and rhythm without murmur gallop or rub normal S1 and S2 Abdomen: negative abdominal pain, nondistended, positive soft, bowel sounds, no rebound, no ascites, no appreciable mass Extremities: No significant cyanosis, clubbing, or edema bilateral lower extremities Skin: Negative rashes, lesions, ulcers Psychiatric: Unable to evaluate secondary to altered mental status Central nervous system: Minimal response to painful stimuli.    .     Data Reviewed:  Care during the described time interval was provided by me .  I have reviewed this patient's available data, including medical history, events of note, physical examination, and all test results as part of my evaluation.   CBC: Recent Labs  Lab 07/06/18 1631 07/07/18 0315 07/09/18 0819  WBC 9.2 10.6* 13.7*  NEUTROABS 7.0  --   --   HGB 11.3* 13.0 10.9*  HCT 39.9 42.2 35.5*  MCV 98.5 91.5 91.0  PLT 146* 177 263   Basic Metabolic Panel: Recent Labs  Lab 07/06/18 2052  07/07/18 0725 07/07/18 1410 07/07/18 1928 07/08/18 0300 07/08/18 0724 07/08/18 1125 07/08/18 1129 07/08/18 1540 07/08/18 1956 07/08/18 2302 07/09/18 0819  NA 153*   < > 154* 153* 151* 149* 147*  --  146* 146* 144 143 142  K 2.7*   < > 2.5* 3.1* 2.9* 2.9* 3.8 3.5  --   --   --   --   --   CL 115*   < > 118* 122* 119* 116* 112*  --   --   --   --   --   --   CO2 26   < > 27 23 25 25 24   --   --   --   --   --   --  GLUCOSE 104*   < > 191* 156* 157* 154* 137*  --   --   --   --   --   --   BUN 25*   < > 26* 25* 25* 24* 24*  --   --   --   --   --   --   CREATININE 3.01*   < > 2.94* 2.69* 2.58* 2.69* 2.69*  --   --   --   --   --   --   CALCIUM >15.0*   < > >15.0* >15.0* >15.0* >15.0* >15.0*  --   --   --   --   --   --   MG 2.5*  --   --   --   --   --   --  1.7  --   --   --   --  1.6*   < > = values in this interval not displayed.   GFR: Estimated Creatinine Clearance: 12.8 mL/min (A) (by C-G formula based on SCr of 2.69 mg/dL (H)). Liver Function Tests: Recent Labs  Lab 07/06/18 1631 07/08/18 0300  AST 39 38  ALT 16 30  ALKPHOS 79 98  BILITOT 2.4* 1.3*  PROT 5.1* 6.2*  ALBUMIN 2.5* 2.8*   No results for input(s): LIPASE, AMYLASE in the last 168 hours. No results for input(s): AMMONIA in the last 168 hours. Coagulation Profile: No results for input(s): INR, PROTIME in the last 168 hours. Cardiac Enzymes: No results for input(s): CKTOTAL, CKMB, CKMBINDEX, TROPONINI in the last 168 hours. BNP  (last 3 results) No results for input(s): PROBNP in the last 8760 hours. HbA1C: No results for input(s): HGBA1C in the last 72 hours. CBG: Recent Labs  Lab 07/06/18 2250  GLUCAP 84   Lipid Profile: No results for input(s): CHOL, HDL, LDLCALC, TRIG, CHOLHDL, LDLDIRECT in the last 72 hours. Thyroid Function Tests: Recent Labs    07/06/18 2052  TSH 0.659   Anemia Panel: No results for input(s): VITAMINB12, FOLATE, FERRITIN, TIBC, IRON, RETICCTPCT in the last 72 hours. Urine analysis:    Component Value Date/Time   COLORURINE YELLOW 07/06/2018 1824   APPEARANCEUR CLEAR 07/06/2018 1824   LABSPEC 1.008 07/06/2018 1824   PHURINE 6.0 07/06/2018 1824   GLUCOSEU NEGATIVE 07/06/2018 1824   HGBUR LARGE (A) 07/06/2018 1824   BILIRUBINUR NEGATIVE 07/06/2018 1824   KETONESUR NEGATIVE 07/06/2018 1824   PROTEINUR 30 (A) 07/06/2018 1824   NITRITE NEGATIVE 07/06/2018 1824   LEUKOCYTESUR NEGATIVE 07/06/2018 1824   Sepsis Labs: @LABRCNTIP (procalcitonin:4,lacticidven:4)  ) Recent Results (from the past 240 hour(s))  SARS Coronavirus 2 (CEPHEID - Performed in [place] hospital lab), Hosp Order     Status: None   Collection Time: 07/06/18  5:15 PM   Specimen: Nasopharyngeal Swab  Result Value Ref Range Status   SARS Coronavirus 2 NEGATIVE NEGATIVE Final    Comment: (NOTE) If result is NEGATIVE SARS-CoV-2 target nucleic acids are NOT DETECTED. The SARS-CoV-2 RNA is generally detectable in upper and lower  respiratory specimens during the acute phase of infection. The lowest  concentration of SARS-CoV-2 viral copies this assay can detect is 250  copies / mL. A negative result does not preclude SARS-CoV-2 infection  and should not be used as the sole basis for treatment or other  patient management decisions.  A negative result may occur with  improper specimen collection / handling, submission of specimen other  than nasopharyngeal swab, presence of viral mutation(s) within the  areas targeted by this assay, and inadequate number of viral copies  (<250 copies / mL). A negative result must be combined with clinical  observations, patient history, and epidemiological information. If result is POSITIVE SARS-CoV-2 target nucleic acids are DETECTED. The SARS-CoV-2 RNA is generally detectable in upper and lower  respiratory specimens dur ing the acute phase of infection.  Positive  results are indicative of active infection with SARS-CoV-2.  Clinical  correlation with patient history and other diagnostic information is  necessary to determine patient infection status.  Positive results do  not rule out bacterial infection or co-infection with other viruses. If result is PRESUMPTIVE POSTIVE SARS-CoV-2 nucleic acids MAY BE PRESENT.   A presumptive positive result was obtained on the submitted specimen  and confirmed on repeat testing.  While 2019 novel coronavirus  (SARS-CoV-2) nucleic acids may be present in the submitted sample  additional confirmatory testing may be necessary for epidemiological  and / or clinical management purposes  to differentiate between  SARS-CoV-2 and other Sarbecovirus currently known to infect humans.  If clinically indicated additional testing with an alternate test  methodology [id number]) is advised. The SARS-CoV-2 RNA is generally  detectable in upper and lower respiratory sp ecimens during the acute  phase of infection. The expected result is Negative. Fact Sheet for Patients:  StrictlyIdeas.no Fact Sheet for Healthcare Providers: BankingDealers.co.za This test is not yet approved or cleared by the [place] FDA and has been authorized for detection and/or diagnosis of SARS-CoV-2 by FDA under an Emergency Use Authorization (EUA).  This EUA will remain in effect (meaning this test can be used) for the duration of the COVID-19 declaration under Section 564(b)(1) of the Act, 21  U.S.C. section 360bbb-3(b)(1), unless the authorization is terminated or revoked sooner. Performed at [facility], [place] [address], [place], [place] [address]   MRSA PCR Screening     Status: None   Collection Time: 07/06/18 11:37 PM   Specimen: Nasal Mucosa; Nasopharyngeal  Result Value Ref Range Status   MRSA by PCR NEGATIVE NEGATIVE Final    Comment:        The GeneXpert MRSA Assay (FDA approved for NASAL specimens only), is one component of a comprehensive MRSA colonization surveillance program. It is not intended to diagnose MRSA infection nor to guide or monitor treatment for MRSA infections. Performed at [facility], [place] [address], [place], [place] [address]          Radiology Studies: US Thyroid  Result Date: 07/09/2018 CLINICAL DATA:  Hyperparathyroidism EXAM: THYROID ULTRASOUND TECHNIQUE: Ultrasound examination of the thyroid gland and adjacent soft tissues was performed. COMPARISON:  06/18/2017 FINDINGS: Parenchymal Echotexture: Moderately heterogenous Isthmus: 0.2 cm thickness, previously 0.3 Right lobe: 6.5 x 3.9 x 3.4 cm, previously 6.4 x 3.2 x 3.3 Left lobe: 7.1 x 2.5 x 1.7 cm, previously 4.6 x 2.5 x 1.9 _________________________________________________________ Estimated total number of nodules >/= 1 cm: 3 Number of spongiform nodules >/=  2 cm not described below (TR1): 0 Number of mixed cystic and solid nodules >/= 1.5 cm not described below ([place]): 0 _________________________________________________________ Nodule # 1: Prior biopsy: No Location: Right; Mid Maximum size: 5.9 cm; Other 2 dimensions: 4.3 x 3.4 cm, previously, measured as 2 separate nodules measuring 2.9 x 2.3 x 2.2 and 2.9 x 3 x 2.9 cm Composition: solid/almost completely solid (2) Echogenicity: hypoechoic (2) Shape: taller-than-wide (3) Margins: smooth (0) Echogenic foci: none (0) ACR TI-RADS total points: 7. ACR TI-RADS risk category:  TR5 (>/= 7 points). Significant change in size (>/=  20% in two dimensions and minimal increase of 2 mm): No Change in features: No Change in ACR TI-RADS risk category: Yes ACR TI-RADS recommendations: **Given size (>/= 1.0 cm) and appearance, fine needle aspiration of this highly suspicious nodule should be considered based on TI-RADS criteria. _________________________________________________________ Nodule # 3: Prior biopsy: No Location: Left; Superior Maximum size: 1 cm; Other 2 dimensions: 0.8 x 0.7 cm, previously, 1 x 0.6 x 1 cm Composition: solid/almost completely solid (2) Echogenicity: isoechoic (1) Shape: not taller-than-wide (0) Margins: ill-defined (0) Echogenic foci: none (0) ACR TI-RADS total points: 3. ACR TI-RADS risk category:  TR3 (3 points). Significant change in size (>/= 20% in two dimensions and minimal increase of 2 mm): No Change in features: No Change in ACR TI-RADS risk category: No ACR TI-RADS recommendations: Given size (<1.4 cm) and appearance, this nodule does NOT meet TI-RADS criteria for biopsy or dedicated follow-up. _________________________________________________________ Nodule # 4: Prior biopsy: No Location: Left; Inferior Maximum size: 3.2 cm; Other 2 dimensions: 3.1 x 2.2 cm, previously, 3.6 x 2.9 x 1.9 cm Composition: solid/almost completely solid (2) Echogenicity: hypoechoic (2) Shape: not taller-than-wide (0) Margins: smooth (0) Echogenic foci: none (0) ACR TI-RADS total points: 4. ACR TI-RADS risk category:  TR4 (4-6 points). Significant change in size (>/= 20% in two dimensions and minimal increase of 2 mm): Change in features: No Change in ACR TI-RADS risk category: No ACR TI-RADS recommendations: **Given size (>/= 1.5 cm) and appearance, fine needle aspiration of this moderately suspicious nodule should be considered based on TI-RADS criteria. _________________________________________________________ IMPRESSION: 1. Thyromegaly with bilateral nodules as above. The above is in keeping with the ACR TI-RADS recommendations - J  Am Coll Radiol 2017;14:587-595. Electronically Signed   By: Lucrezia Europe M.D.   On: 07/09/2018 09:19        Scheduled Meds:  aspirin  300 mg Rectal Daily   heparin  5,000 Units Subcutaneous Q8H   metoprolol tartrate  5 mg Intravenous Q8H   Continuous Infusions:  dextrose 50 mL/hr at 07/09/18 0907     LOS: 3 days   The patient is critically ill with multiple organ systems failure and requires high complexity decision making for assessment and support, frequent evaluation and titration of therapies, application of advanced monitoring technologies and extensive interpretation of multiple databases. Critical Care Time devoted to patient care services described in this note  Time spent: 40 minutes     Aloma Boch, Geraldo Docker, MD [facility] Pager [phone]  If 7PM-7AM, please contact night-coverage [email or web address] Password [facility] 07/09/2018, 12:16 PM

## 2018-07-09 NOTE — Plan of Care (Deleted)

## 2018-07-09 NOTE — Progress Notes (Addendum)
CRITICAL VALUE ALERT  Critical Value: Ca 14.2   Date & Time Notied:  13:08   Provider Notified: Yes  Orders Received/Actions taken: Awaiting orders; RN will continue to monitor

## 2018-07-09 NOTE — Progress Notes (Signed)
Bilateral lower extremity venous duplex completed. Refer to "CV Proc" under chart review to view preliminary results.  07/09/2018 5:32 PM Maudry Mayhew, MHA, RVT, RDCS, RDMS

## 2018-07-09 NOTE — Consult Note (Signed)
Consultation Note Date: 07/09/2018   Patient Name: Stacie Bennett  DOB: May 09, 1927  MRN: [id number]  Age / Sex: 85 y.o., female  PCP: Nolene Ebbs, MD Referring Physician: Allie Bossier, MD  Reason for Consultation: Establishing goals of care  HPI/Patient Profile: 85 y.o. female  with past medical history of HTN, HLD, COPD, stroke in May 2019 w/ bilateral vision loss, PAD, thyroid mass, depression, CKD, and dementia admitted on 07/06/2018 with AMS. Found to have worsening renal function, hypernatremia, and severe hypercalcemia. Thyroid ultrasound revealed increase in size of thyroid nodules. PMT consulted for [place].  Clinical Assessment and Goals of Care: I have reviewed medical records including EPIC notes, labs and imaging, received report from RN, and then spoke with patient's son, [name],  to discuss diagnosis prognosis, GOC, EOL wishes, disposition and options.  I introduced Palliative Medicine as specialized medical care for people living with serious illness. It focuses on providing relief from the symptoms and stress of a serious illness. The goal is to improve quality of life for both the patient and the family.  We discussed a brief life review of the patient. 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 He also tells me he retired about 6 months ago so that he could provide more care for his mother. She has lived with him for several years.   As far as functional and nutritional status, he speaks of a steady decline. He tells me of a decline in ambulatory status - has not walked for the past 3 weeks. He tells me she's been having decreased appetite and trouble swallowing for the past 2 months. He tells me of cognitive decline as well.    We discussed her current illness and what it means in the larger context of her on-going co-morbidities.  Natural disease trajectory and expectations at EOL were discussed. Discussed that some of  what he describes could be related to progression of dementia. Also discussed concern of malignancy.   I attempted to elicit values and goals of care important to the patient.  He tells me all he wants for his mother is for her to have peace and be comfortable.   The difference between aggressive medical intervention and comfort care was considered in light of the patient's goals of care. [name] shares that he does not want any sort of aggressive care or interventions for his mother - he only wants to focus on her comfort. He notes her age and steady decline and tells me it does not make sense to him to put her through procedures that will not change the ultimate outcome. Support provided for his decision.  Hospice and Palliative Care services outpatient were explained and offered. [name] is interested in hospice care - specifically residential hospice. We discussed that the patient is likely eligible for residential hospice d/t her frail status - no longer eating/drinking. We discussed short prognosis. He expresses understanding and shares "I did not think she would survive this hospitalization".  Questions and concerns were addressed. The family was encouraged to call with questions or concerns.   Primary Decision Maker HCPOA/son - Ihsan Rajab    SUMMARY OF RECOMMENDATIONS   - comfort care - DNR/DNR - no further cancer work-up - family interested in hospice facility - social work consulted - PRN comfort medications ordered   Code Status/Advance Care Planning:  DNR   Symptom Management:   PRN dilaudid (avoid morphine in setting of renal failure)  PRN ativan  PRN haldol  PRN robinul  Palliative Prophylaxis:   Aspiration, Frequent Pain Assessment and Turn Reposition  Additional Recommendations (Limitations, Scope, Preferences):  Full Comfort Care  Psycho-social/Spiritual:   Desire for further Chaplaincy support:no  Additional Recommendations: Education on Hospice   Prognosis:   < 2 weeks  Discharge Planning: Hospice facility      Primary Diagnoses: Present on Admission: . COPD (chronic obstructive pulmonary disease) ([place]) . Hyperlipidemia . Hypertension . Stroke ([place]) . Acute metabolic encephalopathy . Hypokalemia . Hypercalcemia . Hypernatremia . Acute renal failure superimposed on stage 3 chronic kidney disease ([place]) . Fall . Thyroid mass . Dehydration   I have reviewed the medical record, interviewed the patient and family, and examined the patient. The following aspects are pertinent.  Past Medical History:  Diagnosis Date  . Acute loss of vision, bilateral 06/16/2017  . Anxiety   . Bronchitis   . Cerebral embolism with cerebral infarction 06/18/2017  . COPD (chronic obstructive pulmonary disease) ([place])   . Glaucoma   . Hyperlipidemia LDL goal <70 06/19/2017  . Hypertension   . Peripheral vascular disease ([place])   . Rheumatoid arthritis ([place])   . Stenosis of left carotid artery   . Stroke ([place])   . Thyroid mass 06/18/2017  . Vertigo    Social History   Socioeconomic History  . Marital status: Single    Spouse name: Not on file  . Number of children: Not on file  . Years of education: Not on file  . Highest education level: Not on file  Occupational History  . Not on file  Social Needs  . Financial resource strain: Not on file  . Food insecurity    Worry: Not on file    Inability: Not on file  . Transportation needs    Medical: Not on file    Non-medical: Not on file  Tobacco Use  . Smoking status: Never Smoker  . Smokeless tobacco: Never Used  Substance and Sexual Activity  . Alcohol use: No  . Drug use: No  . Sexual activity: Not on file  Lifestyle  . Physical activity    Days per week: Not on file    Minutes per session: Not on file  . Stress: Not on file  Relationships  . Social Herbalist on phone: Not on file    Gets together: Not on file    Attends religious service: Not on file     Active member of club or organization: Not on file    Attends meetings of clubs or organizations: Not on file    Relationship status: Not on file  Other Topics Concern  . Not on file  Social History Narrative  . Not on file   Family History  Problem Relation Age of Onset  . Hypertension Other    Scheduled Meds: Continuous Infusions: PRN Meds:.acetaminophen **OR** acetaminophen, albuterol, glycopyrrolate **OR** glycopyrrolate **OR** glycopyrrolate, haloperidol **OR** haloperidol **OR** haloperidol lactate, HYDROmorphone (DILAUDID) injection, LORazepam **OR** LORazepam **OR** LORazepam, ondansetron **OR** ondansetron (ZOFRAN) IV Allergies  Allergen Reactions  . Keflex [Cephalexin] Other (See Comments)    Unknown reaction  . Pletal [Cilostazol] Other (See Comments)    Unknown reaction   Vital Signs: BP (!) 99/50 (BP Location: Left Arm)   Pulse 72   Temp 98.1 F (36.7 C) (Axillary)   Resp (!) 26   Ht 5\' 5"  (1.651 m)   Wt 63.1 kg   SpO2 92%   BMI 23.15 kg/m  Pain Scale:  PAINAD       SpO2: SpO2: 92 % O2 Device:SpO2: 92 % O2 Flow Rate: .   IO: Intake/output summary:   Intake/Output Summary (Last 24 hours) at 07/09/2018 1708 Last data filed at 07/09/2018 0640 Gross per 24 hour  Intake 2809.32 ml  Output 550 ml  Net 2259.32 ml    LBM:   Baseline Weight: Weight: 58.8 kg Most recent weight: Weight: 63.1 kg     Palliative Assessment/Data: PPS 10%    The above conversation was completed via telephone due to the visitor restrictions during the COVID-19 pandemic. Thorough chart review and discussion with necessary members of the care team was completed as part of assessment. All issues were discussed and addressed but no physical exam was performed.  Time Total: 80 minutes Greater than 50%  of this time was spent counseling and coordinating care related to the above assessment and plan.  Juel Burrow, DNP, AGNP-C Palliative Medicine Team [phone] Pager:  [phone]

## 2018-07-10 DIAGNOSIS — E871 Hypo-osmolality and hyponatremia: Secondary | ICD-10-CM

## 2018-07-10 MED ORDER — HALOPERIDOL LACTATE 2 MG/ML PO CONC: 0.5000 mg | ORAL | 0 refills | Status: AC | PRN

## 2018-07-10 MED ORDER — LORAZEPAM 2 MG/ML PO CONC: 1.0000 mg | ORAL | 0 refills | Status: AC | PRN

## 2018-07-10 MED ORDER — GLYCOPYRROLATE 0.2 MG/ML IJ SOLN: 0.2000 mg | INTRAMUSCULAR | 0 refills | Status: AC | PRN

## 2018-07-10 MED ORDER — HYDROMORPHONE HCL 1 MG/ML IJ SOLN: 0.2500 mg | INTRAMUSCULAR | 0 refills | Status: AC

## 2018-07-10 MED ORDER — ALBUTEROL SULFATE (2.5 MG/3ML) 0.083% IN NEBU: 2.5000 mg | INHALATION_SOLUTION | RESPIRATORY_TRACT | 12 refills | Status: AC | PRN

## 2018-07-10 MED ORDER — HALOPERIDOL LACTATE 5 MG/ML IJ SOLN: 0.5000 mg | INTRAMUSCULAR | 0 refills | Status: AC | PRN

## 2018-07-10 MED ORDER — HYDROMORPHONE HCL 1 MG/ML IJ SOLN
0.2500 mg | INTRAMUSCULAR | Status: DC
  Administered 2018-07-10: 15:00:00 0.25 mg via INTRAVENOUS
  Filled 2018-07-10: qty 0.5

## 2018-07-10 MED ORDER — HYDROMORPHONE HCL 1 MG/ML IJ SOLN: 0.5000 mg | INTRAMUSCULAR | 0 refills | Status: AC | PRN

## 2018-07-10 MED ORDER — ONDANSETRON HCL 4 MG/2ML IJ SOLN: 4.0000 mg | Freq: Four times a day (QID) | INTRAMUSCULAR | 0 refills | Status: AC | PRN

## 2018-07-10 MED ORDER — LORAZEPAM 2 MG/ML IJ SOLN: 1.0000 mg | INTRAMUSCULAR | 0 refills | Status: AC | PRN

## 2018-07-10 MED ORDER — HYDROMORPHONE HCL 1 MG/ML IJ SOLN
0.5000 mg | INTRAMUSCULAR | Status: AC
  Administered 2018-07-10: 0.5 mg via INTRAVENOUS

## 2018-07-10 MED ORDER — ACETAMINOPHEN 650 MG RE SUPP: 650.0000 mg | Freq: Four times a day (QID) | RECTAL | 0 refills | Status: AC | PRN

## 2018-07-10 NOTE — Progress Notes (Signed)
Daily Progress Note   Patient Name: Stacie Bennett       Date: 07/10/2018 DOB: 03-14-1927  Age: 85 y.o. MRN#: [id number] Attending Physician: Allie Bossier, MD Primary Care Physician: Nolene Ebbs, MD Admit Date: 07/06/2018  Reason for Consultation/Follow-up: Hospice Evaluation, Inpatient hospice referral, Non pain symptom management, Pain control, Psychosocial/spiritual support and Terminal Care  Subjective: Symptom check performed this AM by palliative care RN - report of discomfort, tachypnea, and groaning. Patient nonverbal.   Length of Stay: 4  Current Medications: Scheduled Meds:  .  HYDROmorphone (DILAUDID) injection  0.25 mg Intravenous Q3H    Continuous Infusions:   PRN Meds: acetaminophen **OR** acetaminophen, albuterol, glycopyrrolate **OR** glycopyrrolate **OR** glycopyrrolate, haloperidol **OR** haloperidol **OR** haloperidol lactate, HYDROmorphone (DILAUDID) injection, LORazepam **OR** LORazepam **OR** LORazepam, ondansetron **OR** ondansetron (ZOFRAN) IV         Vital Signs: BP 117/76 (BP Location: Left Arm)   Pulse (!) 102   Temp 98.6 F (37 C) (Axillary)   Resp (!) 32   Ht 5\' 5"  (1.651 m)   Wt 63.1 kg   SpO2 96%   BMI 23.15 kg/m  SpO2: SpO2: 96 % O2 Device: O2 Device: Room Air O2 Flow Rate:    Intake/output summary:   Intake/Output Summary (Last 24 hours) at 07/10/2018 1218 Last data filed at 07/10/2018 0857 Gross per 24 hour  Intake 436.19 ml  Output 650 ml  Net -213.81 ml   LBM: Last BM Date: 07/09/18 Baseline Weight: Weight: 58.8 kg Most recent weight: Weight: 63.1 kg       Palliative Assessment/Data: PPS 10%    Flowsheet Rows     Most Recent Value  Intake Tab  Referral Department  Hospitalist  Unit at Time of Referral  Intermediate Care Unit   Palliative Care Primary Diagnosis  Cancer  Date Notified  07/08/18  Palliative Care Type  New Palliative care  Reason for referral  Clarify Goals of Care  Date of Admission  07/06/18  Date first seen by Palliative Care  07/09/18  # of days Palliative referral response time  1 Day(s)  # of days IP prior to Palliative referral  2  Clinical Assessment  Palliative Performance Scale Score  10%  Psychosocial & Spiritual Assessment  Palliative Care Outcomes  Patient/Family meeting held?  Yes  Who was at the meeting?  son  Palliative Care Outcomes  Improved pain interventions, Improved non-pain symptom therapy, Clarified goals of care, Counseled regarding hospice, Provided end of life care assistance, Provided psychosocial or spiritual support, Changed to focus on comfort, Transitioned to hospice      Patient Active Problem List   Diagnosis Date Noted  . Goals of care, counseling/discussion   . Comfort measures only status   . Palliative care by specialist   . Acute metabolic encephalopathy 53/64/6803  . Hypokalemia 07/06/2018  . Hypercalcemia 07/06/2018  . Hypernatremia 07/06/2018  . Acute renal failure superimposed on stage 3 chronic kidney disease ([place]) 07/06/2018  . Fall 07/06/2018  . Depression 07/06/2018  . Dehydration   . Low TSH level 06/19/2017  . Hyperlipidemia LDL goal <70 06/19/2017  . Stroke ([place]) 06/19/2017  . Hyperlipidemia   . Cerebral embolism with cerebral infarction  06/18/2017  . Thyroid mass 06/18/2017  . Stenosis of left carotid artery   . COPD (chronic obstructive pulmonary disease) ([place]) 06/16/2017  . Hypertension 06/16/2017  . Acute loss of vision, bilateral 06/16/2017    Palliative Care Assessment & Plan   HPI: 85 y.o. female  with past medical history of HTN, HLD, COPD, stroke in May 2019 w/ bilateral vision loss, PAD, thyroid mass, depression, CKD, and dementia admitted on 07/06/2018 with AMS. Found to have worsening renal function, hypernatremia, and  severe hypercalcemia. Thyroid ultrasound revealed increase in size of thyroid nodules. PMT consulted for [name].  Assessment: Discussed case with Dr. Sherral Hammers with AM.  Based on report from palliative RN (patient exhibiting nonverbal signs of pain, groaning, tachypneic) - stat dose of dilaudid ordered. Low dose dilaudid scheduled throughout the day for patient's symptoms.  [name] is working with family to arrange transfer to their hospice facility.  Spoke with son, Stacie Bennett - discussed above - he again confirms his desire only that his mother be comfortable and support any methods used to ensure her comfort. Discussed hospice home referral. He has spoken with hospice liaison and is awaiting call back to further discuss logistics of transfer to hospice home.   All questions and concerns addressed. He has my contact information.   Recommendations/Plan:  Comfort care  DNR  No further cancer work-up  Hospice facility - hopeful for [place] today  Low dose dilaudid scheduled, continue all PRNs  Goals of Care and Additional Recommendations:  Limitations on Scope of Treatment: Full Comfort Care  Code Status:  DNR  Prognosis:   < 2 weeks  Discharge Planning:  Hospice facility  Care plan was discussed with Dr. Sherral Hammers, palliative RN, patient's son  Thank you for allowing the Palliative Medicine Team to assist in the care of this patient.   Total Time 35 minutes Prolonged Time Billed  no    The above conversation was completed via telephone due to the visitor restrictions during the COVID-19 pandemic. Thorough chart review and discussion with necessary members of the care team was completed as part of assessment. All issues were discussed and addressed but no physical exam was performed.    Greater than 50%  of this time was spent counseling and coordinating care related to the above assessment and plan.  Juel Burrow, DNP, [facility] Palliative  Medicine Team Team Phone # [phone]  Pager [phone]

## 2018-07-10 NOTE — Clinical Social Work Note (Signed)
Hospice referral received regarding patient. CSW contacted by Nunzio Cory (12:43 pm) with [facility] [phone]) and informed that patient accepted and can discharge today. MD contacted and updated. Per Nunzio Cory, son is aware and he will be going to the facility at 3 pm to complete admissions paperwork.  Patient will discharge today to [place] [facility], [address] in [place]. Phone number for report [phone]. Patient will be [facility] by [facility] and picked up at 3:30 pm. CSW signing off as no other SW interventions needed at this time.  Merlyn Bollen Givens, MSW, LCSW Licensed Clinical Social Worker [place] [phone]

## 2018-07-10 NOTE — Progress Notes (Addendum)
Palliative Medicine RN Note: AM symptom check. Patient has not gotten any prn pain meds. Asked to see pt and update PMT NP Kathie Rhodes.  Stacie Bennett is in bed with eyes closed. She is groaning and having labored breathing at about 28 times/minute. I contacted RN [place]; she will give a dose of prn hydromorphone.  Updated PMT NP Shae. She will call her son. No note yet that SW has started hospice referral, but she may already be working on it. Requested that while Darol Destine is talking to her son, she confirms what hospice facility he wants so referral can be initiated.  Stacie Skiff Evaleigh Mccamy, RN, BSN, [facility] Palliative Medicine Team 07/10/2018 10:27 AM Office [phone]   ADDENDUM: Stacie Bennett is requesting [facility] at first choice hospice but would like [facility] if they have a bed faster. I spoke w liaisons for both, and it is likely that pt will get a bed faster at [facility], so referral was initiated to [facility]. I notified SW Sarah via confidential VM.  Stacie Skiff Tyrion Glaude, RN, BSN, [facility] Palliative Medicine Team 07/10/2018 11:12 AM Office [phone]

## 2018-07-10 NOTE — Progress Notes (Signed)
Spoke with Joy at [facility] gave report on patient. At request of facility IV left in place. Purewick removed.   Pt transferred to facility by [facility]. Copy of AVS provided to provide to the facility. Patient belongings purple tee shirt, stripped gown, and multi color blanket sent with patient in patient belongings bag to the facility.

## 2018-07-10 NOTE — Discharge Summary (Signed)
Physician Discharge Summary  Ely Spragg PJA:[id number] DOB: 1927/02/24 DOA: 07/06/2018  PCP: Nolene Ebbs, MD  Admit date: 07/06/2018 Discharge date: 07/10/2018  Time spent: 30 minutes  Recommendations for Outpatient Follow-up  Hyponatremia dehydration -Son decided to make mother comfort care. -Awaiting bed at [facility]  Severe Hypercalcemia -Son decided to make mother comfort care. -Awaiting bed at [facility]  Thyroid mass -Son decided to make mother comfort care. -Awaiting bed at [facility]   Essential HTN -Son decided to make mother comfort care. -Awaiting bed at [facility]  Acute on CKD stage III (baseline Cr<1) -Son decided to make mother comfort care. -Awaiting bed at [facility]   Hypokalemia -Son decided to make mother comfort care. -Awaiting bed at [facility]  Acute metabolic encephalopathy -Son decided to make mother comfort care. -Awaiting bed at [facility]  Hx CVA -Son decided to make mother comfort care. -Awaiting bed at [facility]  Essential HTN -Son decided to make mother comfort care. -Awaiting bed at [facility] .  COPD -Son decided to make mother comfort care. -Awaiting bed at [facility]   Failure to thrive -Transition to hospice  Mechanical fall   Goals of care -Per chart progressively declining over past 6 months worsening since Memorial Day weekend. - Awaiting thyroid ultrasound suspect will show malignancy of parathyroid glands given PTH of 731, at which point discussion will need to be had with POA.  How would you like for [place] to proceed?  Comfort care vs all out cancer work-up -ADDENDUM 6/11 spoke with son Mr [name] and reviewed in detail mothers prognosis.  Stated he thought he was going to transition to comfort care but wanted to first speak to palliative care.  Received a phone call later in the evening from  palliative care that they had spoken with son and he indeed had decided to transition to hospice care.  On 6/12 we will begin process of attempting to transfer patient to [facility]  -Son decided to make mother comfort care. -Awaiting bed at [facility]    Discharge Diagnoses:  Principal Problem:   Acute metabolic encephalopathy Active Problems:   COPD (chronic obstructive pulmonary disease) ([place])   Hypertension   Thyroid mass   Hyperlipidemia   Stroke ([place])   Hypokalemia   Hypercalcemia   Hypernatremia   Acute renal failure superimposed on stage 3 chronic kidney disease ([place])   Fall   Dehydration   Goals of care, counseling/discussion   Comfort measures only status   Palliative care by specialist   Discharge Condition: Guarded  Diet recommendation: None  Filed Weights   07/06/18 2245 07/08/18 1126 07/09/18 0347  Weight: 58.8 kg 62 kg 63.1 kg    History of present illness:  85 year old BF  PMHx HTN, HLD, COPD, stroke, depression, bilateral vision loss, PAD, vertigo, CKD stage III,  thyroid mass, dementia   Presented with progressive decline over the last 2 to 3 weeks, worsened couple days PTA including poor oral intake, drowsiness, confusion, fall with head injury. Admitted for hypernatremic dehydration, hypercalcemia, acute on chronic kidney disease and acute metabolic encephalopathy. Nephrology consulted.  Hospital Course:  ADDENDUM 6/11 spoke with son Mr [name] and reviewed in detail mothers prognosis.  Stated he thought he was going to transition to comfort care but wanted to first speak to palliative care.  Received a phone call later in the evening from palliative care that they had spoken with  son and he indeed had decided to transition to hospice care.  On 6/12 we will begin process of attempting to transfer patient to [facility]          Discharge Exam: Vitals:   07/10/18 1207 07/10/18 1208 07/10/18 1209 07/10/18 1227  BP: (!) 111/92      Pulse:      Resp:      Temp: 99.2 F (37.3 C) 98.9 F (37.2 C) 99.2 F (37.3 C) 99.3 F (37.4 C)  TempSrc: Axillary Axillary Axillary Axillary  SpO2:      Weight:      Height:        General: A/O x0 no acute respiratory distress Eyes: negative scleral hemorrhage, negative anisocoria, negative icterus ENT: Negative Runny nose, negative gingival bleeding, Neck:  Negative scars, masses, torticollis, lymphadenopathy, JVD Lungs: Clear to auscultation bilaterally without wheezes or crackles Cardiovascular: Regular rate and rhythm without murmur gallop or rub normal S1 and S2    Discharge Instructions   Allergies as of 07/10/2018      Reactions   Keflex [cephalexin] Other (See Comments)   Unknown reaction   Pletal [cilostazol] Other (See Comments)   Unknown reaction      Medication List    STOP taking these medications   amLODipine 5 MG tablet Commonly known as: NORVASC   aspirin 325 MG EC tablet   atorvastatin 80 MG tablet Commonly known as: LIPITOR   clopidogrel 75 MG tablet Commonly known as: PLAVIX   risperiDONE 1 MG tablet Commonly known as: RISPERDAL   rosuvastatin 40 MG tablet Commonly known as: CRESTOR     TAKE these medications   acetaminophen 650 MG suppository Commonly known as: TYLENOL Place 1 suppository (650 mg total) rectally every 6 (six) hours as needed for mild pain (or Fever >/= 101).   albuterol (2.5 MG/3ML) 0.083% nebulizer solution Commonly known as: PROVENTIL Take 3 mLs (2.5 mg total) by nebulization every 4 (four) hours as needed for wheezing or shortness of breath. What changed:   when to take this  Another medication with the same name was removed. Continue taking this medication, and follow the directions you see here.   glycopyrrolate 0.2 MG/ML injection Commonly known as: ROBINUL Inject 1 mL (0.2 mg total) into the skin every 4 (four) hours as needed (excessive secretions).   glycopyrrolate 0.2 MG/ML injection Commonly  known as: ROBINUL Inject 1 mL (0.2 mg total) into the vein every 4 (four) hours as needed (excessive secretions).   haloperidol 2 MG/ML solution Commonly known as: HALDOL Place 0.3 mLs (0.6 mg total) under the tongue every 4 (four) hours as needed for agitation (or delirium).   haloperidol lactate 5 MG/ML injection Commonly known as: HALDOL Inject 0.1 mLs (0.5 mg total) into the vein every 4 (four) hours as needed (or delirium).   HYDROmorphone 1 MG/ML injection Commonly known as: DILAUDID Inject 0.25 mLs (0.25 mg total) into the vein every 3 (three) hours.   HYDROmorphone 1 MG/ML injection Commonly known as: DILAUDID Inject 0.5 mLs (0.5 mg total) into the vein every 2 (two) hours as needed for severe pain (or dyspnea).   latanoprost 0.005 % ophthalmic solution Commonly known as: XALATAN Apply 1 drop to eye daily.   LORazepam 2 MG/ML concentrated solution Commonly known as: ATIVAN Place 0.5 mLs (1 mg total) under the tongue every 4 (four) hours as needed for anxiety.   LORazepam 2 MG/ML injection Commonly known as: ATIVAN Inject 0.5 mLs (1 mg total) into  the vein every 4 (four) hours as needed for anxiety.   ondansetron 4 MG/2ML Soln injection Commonly known as: ZOFRAN Inject 2 mLs (4 mg total) into the vein every 6 (six) hours as needed for nausea.      Allergies  Allergen Reactions  . Keflex [Cephalexin] Other (See Comments)    Unknown reaction  . Pletal [Cilostazol] Other (See Comments)    Unknown reaction      The results of significant diagnostics from this hospitalization (including imaging, microbiology, ancillary and laboratory) are listed below for reference.    Significant Diagnostic Studies: Ct Head Wo Contrast  Result Date: 07/06/2018 CLINICAL DATA:  Altered level of consciousness EXAM: CT HEAD WITHOUT CONTRAST TECHNIQUE: Contiguous axial images were obtained from the base of the skull through the vertex without intravenous contrast. COMPARISON:   07/09/2017 FINDINGS: Brain: No evidence of acute infarction, hemorrhage, hydrocephalus, extra-axial collection or mass lesion/mass effect. Bilateral parietooccipital encephalomalacia is noted with significant progression on the left when compared to prior study. There is significant volume loss and extensive chronic microvascular ischemic changes. Vascular: No hyperdense vessel or unexpected calcification. Skull: Normal. Negative for fracture or focal lesion. Sinuses/Orbits: No acute finding. Other: None. IMPRESSION: 1. No acute intracranial abnormality detected. 2. Significant progression of encephalomalacia in involving the left parietooccipital region. Additional old infarcts are noted above. Electronically Signed   By: Constance Holster M.D.   On: 07/06/2018 19:03   US Renal  Result Date: 07/06/2018 CLINICAL DATA:  Acute kidney injury EXAM: RENAL / URINARY TRACT ULTRASOUND COMPLETE COMPARISON:  None. FINDINGS: Right Kidney: Renal measurements: 8.5 x 3.6 x 3.9 cm = volume: 62 mL . Echogenicity within normal limits. No mass or hydronephrosis visualized. Left Kidney: Renal measurements: 8.9 x 4.7 x 3.7 cm = volume: 79 mL. Echogenicity within normal limits. No mass or hydronephrosis visualized. Bladder: Appears normal for degree of bladder distention. Both ureteral jets were visualized. IMPRESSION: No acute sonographic abnormality. No specific abnormality to explain the patient's acute kidney injury. Electronically Signed   By: Constance Holster M.D.   On: 07/06/2018 21:39   Dg Chest Port 1 View  Result Date: 07/06/2018 CLINICAL DATA:  Weakness and fatigue has been getting severely worse for past 3 weeks EXAM: PORTABLE CHEST - 1 VIEW COMPARISON:  09/25/2014. FINDINGS: Chronic changes of left upper thoracoplasty. Progressive right paratracheal substernal goiter. No pulmonary infiltrate or edema. Heart size normal. No effusion. IMPRESSION: No acute cardiopulmonary disease. Electronically Signed   By: Lucrezia Europe M.D.   On: 07/06/2018 16:55   US Thyroid  Result Date: 07/09/2018 CLINICAL DATA:  Hyperparathyroidism EXAM: THYROID ULTRASOUND TECHNIQUE: Ultrasound examination of the thyroid gland and adjacent soft tissues was performed. COMPARISON:  06/18/2017 FINDINGS: Parenchymal Echotexture: Moderately heterogenous Isthmus: 0.2 cm thickness, previously 0.3 Right lobe: 6.5 x 3.9 x 3.4 cm, previously 6.4 x 3.2 x 3.3 Left lobe: 7.1 x 2.5 x 1.7 cm, previously 4.6 x 2.5 x 1.9 _________________________________________________________ Estimated total number of nodules >/= 1 cm: 3 Number of spongiform nodules >/=  2 cm not described below (TR1): 0 Number of mixed cystic and solid nodules >/= 1.5 cm not described below ([place]): 0 _________________________________________________________ Nodule # 1: Prior biopsy: No Location: Right; Mid Maximum size: 5.9 cm; Other 2 dimensions: 4.3 x 3.4 cm, previously, measured as 2 separate nodules measuring 2.9 x 2.3 x 2.2 and 2.9 x 3 x 2.9 cm Composition: solid/almost completely solid (2) Echogenicity: hypoechoic (2) Shape: taller-than-wide (3) Margins: smooth (0) Echogenic foci: none (  0) ACR TI-RADS total points: 7. ACR TI-RADS risk category:  TR5 (>/= 7 points). Significant change in size (>/= 20% in two dimensions and minimal increase of 2 mm): No Change in features: No Change in ACR TI-RADS risk category: Yes ACR TI-RADS recommendations: **Given size (>/= 1.0 cm) and appearance, fine needle aspiration of this highly suspicious nodule should be considered based on TI-RADS criteria. _________________________________________________________ Nodule # 3: Prior biopsy: No Location: Left; Superior Maximum size: 1 cm; Other 2 dimensions: 0.8 x 0.7 cm, previously, 1 x 0.6 x 1 cm Composition: solid/almost completely solid (2) Echogenicity: isoechoic (1) Shape: not taller-than-wide (0) Margins: ill-defined (0) Echogenic foci: none (0) ACR TI-RADS total points: 3. ACR TI-RADS risk category:  TR3  (3 points). Significant change in size (>/= 20% in two dimensions and minimal increase of 2 mm): No Change in features: No Change in ACR TI-RADS risk category: No ACR TI-RADS recommendations: Given size (<1.4 cm) and appearance, this nodule does NOT meet TI-RADS criteria for biopsy or dedicated follow-up. _________________________________________________________ Nodule # 4: Prior biopsy: No Location: Left; Inferior Maximum size: 3.2 cm; Other 2 dimensions: 3.1 x 2.2 cm, previously, 3.6 x 2.9 x 1.9 cm Composition: solid/almost completely solid (2) Echogenicity: hypoechoic (2) Shape: not taller-than-wide (0) Margins: smooth (0) Echogenic foci: none (0) ACR TI-RADS total points: 4. ACR TI-RADS risk category:  TR4 (4-6 points). Significant change in size (>/= 20% in two dimensions and minimal increase of 2 mm): Change in features: No Change in ACR TI-RADS risk category: No ACR TI-RADS recommendations: **Given size (>/= 1.5 cm) and appearance, fine needle aspiration of this moderately suspicious nodule should be considered based on TI-RADS criteria. _________________________________________________________ IMPRESSION: 1. Thyromegaly with bilateral nodules as above. The above is in keeping with the ACR TI-RADS recommendations - J Am Coll Radiol 2017;14:587-595. Electronically Signed   By: Lucrezia Europe M.D.   On: 07/09/2018 09:19   Vas [place] Lower Extremity Venous (dvt)  Result Date: 07/10/2018  Lower Venous Study Indications: Swelling.  Limitations: Body habitus, poor ultrasound/tissue interface and patient unable to cooperate. Poor patient position. Comparison Study: No prior study Performing Technologist: Maudry Mayhew MHA, RDMS, RVT, RDCS  Examination Guidelines: A complete evaluation includes B-mode imaging, spectral Doppler, color Doppler, and power Doppler as needed of all accessible portions of each vessel. Bilateral testing is considered an integral part of a complete examination. Limited examinations for  reoccurring indications may be performed as noted.  +---------+---------------+---------+-----------+----------+-------+ RIGHT    CompressibilityPhasicitySpontaneityPropertiesSummary +---------+---------------+---------+-----------+----------+-------+ CFV      Full           Yes      Yes                          +---------+---------------+---------+-----------+----------+-------+ SFJ      Full                                                 +---------+---------------+---------+-----------+----------+-------+ FV Prox  Full                                                 +---------+---------------+---------+-----------+----------+-------+ FV Mid   Full                                                 +---------+---------------+---------+-----------+----------+-------+  FV DistalFull                                                 +---------+---------------+---------+-----------+----------+-------+ PFV      Full                                                 +---------+---------------+---------+-----------+----------+-------+ POP      Full           Yes      Yes                          +---------+---------------+---------+-----------+----------+-------+ PTV      Full                                                 +---------+---------------+---------+-----------+----------+-------+ PERO     Full                                                 +---------+---------------+---------+-----------+----------+-------+   +---------+---------------+---------+-----------+----------+--------------+ LEFT     CompressibilityPhasicitySpontaneityPropertiesSummary        +---------+---------------+---------+-----------+----------+--------------+ CFV      Full           Yes      Yes                                 +---------+---------------+---------+-----------+----------+--------------+ SFJ      Full                                                         +---------+---------------+---------+-----------+----------+--------------+ FV Prox  Full                                                        +---------+---------------+---------+-----------+----------+--------------+ FV Mid   Full                                                        +---------+---------------+---------+-----------+----------+--------------+ FV Distal                                             Not visualized +---------+---------------+---------+-----------+----------+--------------+ PFV      Full                                                        +---------+---------------+---------+-----------+----------+--------------+  POP      Full           Yes      Yes                                 +---------+---------------+---------+-----------+----------+--------------+ PTV                                                   Not visualized +---------+---------------+---------+-----------+----------+--------------+ PERO                                                  Not visualized +---------+---------------+---------+-----------+----------+--------------+     Summary: Right: There is no evidence of deep vein thrombosis in the lower extremity. No cystic structure found in the popliteal fossa. Left: There is no evidence of deep vein thrombosis in the lower extremity. However, portions of this examination were limited- see technologist comments above. No cystic structure found in the popliteal fossa.  *See table(s) above for measurements and observations. Electronically signed by Servando Snare MD on 07/10/2018 at 8:27:59 AM.    Final     Microbiology: Recent Results (from the past 240 hour(s))  SARS Coronavirus 2 (CEPHEID - Performed in [facility] hospital lab), Hosp Order     Status: None   Collection Time: 07/06/18  5:15 PM   Specimen: Nasopharyngeal Swab  Result Value Ref Range Status   SARS Coronavirus 2 NEGATIVE NEGATIVE Final     Comment: (NOTE) If result is NEGATIVE SARS-CoV-2 target nucleic acids are NOT DETECTED. The SARS-CoV-2 RNA is generally detectable in upper and lower  respiratory specimens during the acute phase of infection. The lowest  concentration of SARS-CoV-2 viral copies this assay can detect is 250  copies / mL. A negative result does not preclude SARS-CoV-2 infection  and should not be used as the sole basis for treatment or other  patient management decisions.  A negative result may occur with  improper specimen collection / handling, submission of specimen other  than nasopharyngeal swab, presence of viral mutation(s) within the  areas targeted by this assay, and inadequate number of viral copies  (<250 copies / mL). A negative result must be combined with clinical  observations, patient history, and epidemiological information. If result is POSITIVE SARS-CoV-2 target nucleic acids are DETECTED. The SARS-CoV-2 RNA is generally detectable in upper and lower  respiratory specimens dur ing the acute phase of infection.  Positive  results are indicative of active infection with SARS-CoV-2.  Clinical  correlation with patient history and other diagnostic information is  necessary to determine patient infection status.  Positive results do  not rule out bacterial infection or co-infection with other viruses. If result is PRESUMPTIVE POSTIVE SARS-CoV-2 nucleic acids MAY BE PRESENT.   A presumptive positive result was obtained on the submitted specimen  and confirmed on repeat testing.  While 2019 novel coronavirus  (SARS-CoV-2) nucleic acids may be present in the submitted sample  additional confirmatory testing may be necessary for epidemiological  and / or clinical management purposes  to differentiate between  SARS-CoV-2 and other Sarbecovirus currently known to infect humans.  If clinically indicated additional testing with  an alternate test  methodology [id number]) is advised. The SARS-CoV-2  RNA is generally  detectable in upper and lower respiratory sp ecimens during the acute  phase of infection. The expected result is Negative. Fact Sheet for Patients:  StrictlyIdeas.no Fact Sheet for Healthcare Providers: BankingDealers.co.za This test is not yet approved or cleared by the [place] FDA and has been authorized for detection and/or diagnosis of SARS-CoV-2 by FDA under an Emergency Use Authorization (EUA).  This EUA will remain in effect (meaning this test can be used) for the duration of the COVID-19 declaration under Section 564(b)(1) of the Act, 21 U.S.C. section 360bbb-3(b)(1), unless the authorization is terminated or revoked sooner. Performed at [facility], [place] [address] [place], [place] [address]   MRSA PCR Screening     Status: None   Collection Time: 07/06/18 11:37 PM   Specimen: Nasal Mucosa; Nasopharyngeal  Result Value Ref Range Status   MRSA by PCR NEGATIVE NEGATIVE Final    Comment:        The GeneXpert MRSA Assay (FDA approved for NASAL specimens only), is one component of a comprehensive MRSA colonization surveillance program. It is not intended to diagnose MRSA infection nor to guide or monitor treatment for MRSA infections. Performed at [facility], [place] [address], [place], [place] [address]      Labs: Basic Metabolic Panel: Recent Labs  Lab 07/06/18 2052  07/07/18 1410 07/07/18 1928 07/08/18 0300 07/08/18 0724 07/08/18 1125  07/08/18 1956 07/08/18 2302 07/09/18 0819 07/09/18 1209 07/09/18 1638  NA 153*   < > 153* 151* 149* 147*  --    < > 144 143 142 140 140  K 2.7*   < > 3.1* 2.9* 2.9* 3.8 3.5  --   --   --   --  3.4*  --   CL 115*   < > 122* 119* 116* 112*  --   --   --   --   --  111  --   CO2 26   < > 23 25 25 24   --   --   --   --   --  21*  --   GLUCOSE 104*   < > 156* 157* 154* 137*  --   --   --   --   --  90  --   BUN 25*   < > 25* 25* 24* 24*  --   --    --   --   --  36*  --   CREATININE 3.01*   < > 2.69* 2.58* 2.69* 2.69*  --   --   --   --   --  3.47*  --   CALCIUM >15.0*   < > >15.0* >15.0* >15.0* >15.0*  --   --   --   --   --  14.2*  --   MG 2.5*  --   --   --   --   --  1.7  --   --   --  1.6*  --   --    < > = values in this interval not displayed.   Liver Function Tests: Recent Labs  Lab 07/06/18 1631 07/08/18 0300  AST 39 38  ALT 16 30  ALKPHOS 79 98  BILITOT 2.4* 1.3*  PROT 5.1* 6.2*  ALBUMIN 2.5* 2.8*   No results for input(s): LIPASE, AMYLASE in the last 168 hours. No results for input(s): AMMONIA in the  last 168 hours. CBC: Recent Labs  Lab 07/06/18 1631 07/07/18 0315 07/09/18 0819  WBC 9.2 10.6* 13.7*  NEUTROABS 7.0  --   --   HGB 11.3* 13.0 10.9*  HCT 39.9 42.2 35.5*  MCV 98.5 91.5 91.0  PLT 146* 177 169   Cardiac Enzymes: No results for input(s): CKTOTAL, CKMB, CKMBINDEX, TROPONINI in the last 168 hours. BNP: BNP (last 3 results) No results for input(s): BNP in the last 8760 hours.  ProBNP (last 3 results) No results for input(s): PROBNP in the last 8760 hours.  CBG: Recent Labs  Lab 07/06/18 2250  GLUCAP 84       Signed:  Dia Crawford, MD [facility] [phone] pager

## 2018-07-10 NOTE — Progress Notes (Signed)
PROGRESS NOTE    Stacie Bennett  QIO:[id number] DOB: [birth year] DOA: 07/06/2018 PCP: Nolene Ebbs, MD   Brief Narrative:  85 year old BF  PMHx HTN, HLD, COPD, stroke, depression, bilateral vision loss, PAD, vertigo, CKD stage III,  thyroid mass, dementia   Presented with progressive decline over the last 2 to 3 weeks, worsened couple days PTA including poor oral intake, drowsiness, confusion, fall with head injury.  Admitted for hypernatremic dehydration, hypercalcemia, acute on chronic kidney disease and acute metabolic encephalopathy.  Nephrology consulted.      Subjective: 6/12 A/O x0 minimally responsive to painful stimuli.    Assessment & Plan:   Principal Problem:   Acute metabolic encephalopathy Active Problems:   COPD (chronic obstructive pulmonary disease) (HCC)   Hypertension   Thyroid mass   Hyperlipidemia   Stroke (HCC)   Hypokalemia   Hypercalcemia   Hypernatremia   Acute renal failure superimposed on stage 3 chronic kidney disease (HCC)   Fall   Dehydration   Goals of care, counseling/discussion   Comfort measures only status   Palliative care by specialist  Hyponatremia dehydration -Son decided to make mother comfort care. -Awaiting bed at [facility]  Severe Hypercalcemia -Son decided to make mother comfort care. -Awaiting bed at [facility]  Thyroid mass -Son decided to make mother comfort care. -Awaiting bed at [facility]   Essential HTN -Son decided to make mother comfort care. -Awaiting bed at [facility]  Acute on CKD stage III (baseline Cr<1) -Son decided to make mother comfort care. -Awaiting bed at [facility]   Hypokalemia -Son decided to make mother comfort care. -Awaiting bed at [facility]  Acute metabolic encephalopathy -Son decided to make mother comfort care. -Awaiting bed at [facility]  Hx CVA -Son decided to make mother comfort care. -Awaiting bed at  [facility]  Essential HTN -Son decided to make mother comfort care. -Awaiting bed at [facility] .  COPD -Son decided to make mother comfort care. -Awaiting bed at [facility]   Failure to thrive -Transition to hospice  Mechanical fall   Goals of care -Per chart progressively declining over past 6 months worsening since Memorial Day weekend. - Awaiting thyroid ultrasound suspect will show malignancy of parathyroid glands given PTH of 731, at which point discussion will need to be had with POA.  How would you like for [place] to proceed?  Comfort care vs all out cancer work-up -ADDENDUM 6/11 spoke with son Mr [name] and reviewed in detail mothers prognosis.  Stated he thought he was going to transition to comfort care but wanted to first speak to palliative care.  Received a phone call later in the evening from palliative care that they had spoken with son and he indeed had decided to transition to hospice care.  On 6/12 we will begin process of attempting to transfer patient to [facility]  -Son decided to make mother comfort care. -Awaiting bed at [facility]      DVT prophylaxis: Subcu heparin Code Status: DNR Family Communication: None Disposition Plan: TBD   Consultants:  Per chart Case discussed with nephrology Dr. Augustin Coupe    Procedures/Significant Events:  06/18/2017 US thyroid:-Thyromegaly with bilateral nodules. - Recommend FNA biopsy of moderately suspicious 3.6 cm inferior left AND 3 cm inferior right nodules. -Recommend 1 year surveillance ultrasound of additional lesions as above.     I have personally reviewed and interpreted all radiology studies and  my findings are as above.  VENTILATOR SETTINGS:    Cultures   Antimicrobials: Anti-infectives (From admission, onward)   None       Devices   LINES / TUBES:    Continuous Infusions:    Objective: Vitals:   07/09/18 1559 07/09/18 1945 07/10/18 0731  07/10/18 0742  BP: (!) 99/50 116/68 95/60 95/60   Pulse: 72 (!) 102 (!) 107 (!) 106  Resp: (!) 26 (!) 30 (!) 23 20  Temp: 98.1 F (36.7 C) 99 F (37.2 C)  98.7 F (37.1 C)  TempSrc: Axillary Axillary Axillary Axillary  SpO2: 92% 93% 93% 95%  Weight:      Height:        Intake/Output Summary (Last 24 hours) at 07/10/2018 0174 Last data filed at 07/10/2018 0857 Gross per 24 hour  Intake 436.19 ml  Output 650 ml  Net -213.81 ml   Filed Weights   07/06/18 2245 07/08/18 1126 07/09/18 0347  Weight: 58.8 kg 62 kg 63.1 kg    Physical Exam:  General: A/O x0 no acute respiratory distress Eyes: negative scleral hemorrhage, negative anisocoria, negative icterus ENT: Negative Runny nose, negative gingival bleeding, Neck:  Negative scars, masses, torticollis, lymphadenopathy, JVD Lungs: Clear to auscultation bilaterally without wheezes or crackles Cardiovascular: Regular rate and rhythm without murmur gallop or rub normal S1 and S2 Abdomen: negative abdominal pain, nondistended, positive soft, bowel sounds, no rebound, no ascites, no appreciable mass Extremities: No significant cyanosis, clubbing, or edema bilateral lower extremities Skin: Negative rashes, lesions, ulcers Psychiatric: Unable to evaluate secondary to altered mental status Central nervous system: Minimal response to painful stimuli.    .     Data Reviewed: Care during the described time interval was provided by me .  I have reviewed this patient's available data, including medical history, events of note, physical examination, and all test results as part of my evaluation.   CBC: Recent Labs  Lab 07/06/18 1631 07/07/18 0315 07/09/18 0819  WBC 9.2 10.6* 13.7*  NEUTROABS 7.0  --   --   HGB 11.3* 13.0 10.9*  HCT 39.9 42.2 35.5*  MCV 98.5 91.5 91.0  PLT 146* 177 944   Basic Metabolic Panel: Recent Labs  Lab 07/06/18 2052  07/07/18 1410 07/07/18 1928 07/08/18 0300 07/08/18 0724 07/08/18 1125   07/08/18 1956 07/08/18 2302 07/09/18 0819 07/09/18 1209 07/09/18 1638  NA 153*   < > 153* 151* 149* 147*  --    < > 144 143 142 140 140  K 2.7*   < > 3.1* 2.9* 2.9* 3.8 3.5  --   --   --   --  3.4*  --   CL 115*   < > 122* 119* 116* 112*  --   --   --   --   --  111  --   CO2 26   < > 23 25 25 24   --   --   --   --   --  21*  --   GLUCOSE 104*   < > 156* 157* 154* 137*  --   --   --   --   --  90  --   BUN 25*   < > 25* 25* 24* 24*  --   --   --   --   --  36*  --   CREATININE 3.01*   < > 2.69* 2.58* 2.69* 2.69*  --   --   --   --   --  3.47*  --   CALCIUM >15.0*   < > >15.0* >15.0* >15.0* >15.0*  --   --   --   --   --  14.2*  --   MG 2.5*  --   --   --   --   --  1.7  --   --   --  1.6*  --   --    < > = values in this interval not displayed.   GFR: Estimated Creatinine Clearance: 9.9 mL/min (A) (by C-G formula based on SCr of 3.47 mg/dL (H)). Liver Function Tests: Recent Labs  Lab 07/06/18 1631 07/08/18 0300  AST 39 38  ALT 16 30  ALKPHOS 79 98  BILITOT 2.4* 1.3*  PROT 5.1* 6.2*  ALBUMIN 2.5* 2.8*   No results for input(s): LIPASE, AMYLASE in the last 168 hours. No results for input(s): AMMONIA in the last 168 hours. Coagulation Profile: No results for input(s): INR, PROTIME in the last 168 hours. Cardiac Enzymes: No results for input(s): CKTOTAL, CKMB, CKMBINDEX, TROPONINI in the last 168 hours. BNP (last 3 results) No results for input(s): PROBNP in the last 8760 hours. HbA1C: No results for input(s): HGBA1C in the last 72 hours. CBG: Recent Labs  Lab 07/06/18 2250  GLUCAP 84   Lipid Profile: No results for input(s): CHOL, HDL, LDLCALC, TRIG, CHOLHDL, LDLDIRECT in the last 72 hours. Thyroid Function Tests: No results for input(s): TSH, T4TOTAL, FREET4, T3FREE, THYROIDAB in the last 72 hours. Anemia Panel: No results for input(s): VITAMINB12, FOLATE, FERRITIN, TIBC, IRON, RETICCTPCT in the last 72 hours. Urine analysis:    Component Value Date/Time    COLORURINE YELLOW 07/06/2018 1824   APPEARANCEUR CLEAR 07/06/2018 1824   LABSPEC 1.008 07/06/2018 1824   PHURINE 6.0 07/06/2018 1824   GLUCOSEU NEGATIVE 07/06/2018 1824   HGBUR LARGE (A) 07/06/2018 1824   BILIRUBINUR NEGATIVE 07/06/2018 1824   KETONESUR NEGATIVE 07/06/2018 1824   PROTEINUR 30 (A) 07/06/2018 1824   NITRITE NEGATIVE 07/06/2018 1824   LEUKOCYTESUR NEGATIVE 07/06/2018 1824   Sepsis Labs: @LABRCNTIP (procalcitonin:4,lacticidven:4)  ) Recent Results (from the past 240 hour(s))  SARS Coronavirus 2 (CEPHEID - Performed in [place] hospital lab), Hosp Order     Status: None   Collection Time: 07/06/18  5:15 PM   Specimen: Nasopharyngeal Swab  Result Value Ref Range Status   SARS Coronavirus 2 NEGATIVE NEGATIVE Final    Comment: (NOTE) If result is NEGATIVE SARS-CoV-2 target nucleic acids are NOT DETECTED. The SARS-CoV-2 RNA is generally detectable in upper and lower  respiratory specimens during the acute phase of infection. The lowest  concentration of SARS-CoV-2 viral copies this assay can detect is 250  copies / mL. A negative result does not preclude SARS-CoV-2 infection  and should not be used as the sole basis for treatment or other  patient management decisions.  A negative result may occur with  improper specimen collection / handling, submission of specimen other  than nasopharyngeal swab, presence of viral mutation(s) within the  areas targeted by this assay, and inadequate number of viral copies  (<250 copies / mL). A negative result must be combined with clinical  observations, patient history, and epidemiological information. If result is POSITIVE SARS-CoV-2 target nucleic acids are DETECTED. The SARS-CoV-2 RNA is generally detectable in upper and lower  respiratory specimens dur ing the acute phase of infection.  Positive  results are indicative of active infection with SARS-CoV-2.  Clinical  correlation with patient history and other diagnostic  information is  necessary to determine patient infection status.  Positive results do  not rule out bacterial infection or co-infection with other viruses. If result is PRESUMPTIVE POSTIVE SARS-CoV-2 nucleic acids MAY BE PRESENT.   A presumptive positive result was obtained on the submitted specimen  and confirmed on repeat testing.  While 2019 novel coronavirus  (SARS-CoV-2) nucleic acids may be present in the submitted sample  additional confirmatory testing may be necessary for epidemiological  and / or clinical management purposes  to differentiate between  SARS-CoV-2 and other Sarbecovirus currently known to infect humans.  If clinically indicated additional testing with an alternate test  methodology [phone]) is advised. The SARS-CoV-2 RNA is generally  detectable in upper and lower respiratory sp ecimens during the acute  phase of infection. The expected result is Negative. Fact Sheet for Patients:  StrictlyIdeas.no Fact Sheet for Healthcare Providers: BankingDealers.co.za This test is not yet approved or cleared by the [place] FDA and has been authorized for detection and/or diagnosis of SARS-CoV-2 by FDA under an Emergency Use Authorization (EUA).  This EUA will remain in effect (meaning this test can be used) for the duration of the COVID-19 declaration under Section 564(b)(1) of the Act, 21 U.S.C. section 360bbb-3(b)(1), unless the authorization is terminated or revoked sooner. Performed at [facility], [place] [address], [place], [place] [address]   MRSA PCR Screening     Status: None   Collection Time: 07/06/18 11:37 PM   Specimen: Nasal Mucosa; Nasopharyngeal  Result Value Ref Range Status   MRSA by PCR NEGATIVE NEGATIVE Final    Comment:        The GeneXpert MRSA Assay (FDA approved for NASAL specimens only), is one component of a comprehensive MRSA colonization surveillance program. It is not intended to  diagnose MRSA infection nor to guide or monitor treatment for MRSA infections. Performed at [facility], [place] [address], [place], [place] [address]          Radiology Studies: US Thyroid  Result Date: 07/09/2018 CLINICAL DATA:  Hyperparathyroidism EXAM: THYROID ULTRASOUND TECHNIQUE: Ultrasound examination of the thyroid gland and adjacent soft tissues was performed. COMPARISON:  06/18/2017 FINDINGS: Parenchymal Echotexture: Moderately heterogenous Isthmus: 0.2 cm thickness, previously 0.3 Right lobe: 6.5 x 3.9 x 3.4 cm, previously 6.4 x 3.2 x 3.3 Left lobe: 7.1 x 2.5 x 1.7 cm, previously 4.6 x 2.5 x 1.9 _________________________________________________________ Estimated total number of nodules >/= 1 cm: 3 Number of spongiform nodules >/=  2 cm not described below (TR1): 0 Number of mixed cystic and solid nodules >/= 1.5 cm not described below ([place]): 0 _________________________________________________________ Nodule # 1: Prior biopsy: No Location: Right; Mid Maximum size: 5.9 cm; Other 2 dimensions: 4.3 x 3.4 cm, previously, measured as 2 separate nodules measuring 2.9 x 2.3 x 2.2 and 2.9 x 3 x 2.9 cm Composition: solid/almost completely solid (2) Echogenicity: hypoechoic (2) Shape: taller-than-wide (3) Margins: smooth (0) Echogenic foci: none (0) ACR TI-RADS total points: 7. ACR TI-RADS risk category:  TR5 (>/= 7 points). Significant change in size (>/= 20% in two dimensions and minimal increase of 2 mm): No Change in features: No Change in ACR TI-RADS risk category: Yes ACR TI-RADS recommendations: **Given size (>/= 1.0 cm) and appearance, fine needle aspiration of this highly suspicious nodule should be considered based on TI-RADS criteria. _________________________________________________________ Nodule # 3: Prior biopsy: No Location: Left; Superior Maximum size: 1 cm; Other 2 dimensions: 0.8 x 0.7 cm, previously, 1 x 0.6  x 1 cm Composition: solid/almost completely solid (2) Echogenicity:  isoechoic (1) Shape: not taller-than-wide (0) Margins: ill-defined (0) Echogenic foci: none (0) ACR TI-RADS total points: 3. ACR TI-RADS risk category:  TR3 (3 points). Significant change in size (>/= 20% in two dimensions and minimal increase of 2 mm): No Change in features: No Change in ACR TI-RADS risk category: No ACR TI-RADS recommendations: Given size (<1.4 cm) and appearance, this nodule does NOT meet TI-RADS criteria for biopsy or dedicated follow-up. _________________________________________________________ Nodule # 4: Prior biopsy: No Location: Left; Inferior Maximum size: 3.2 cm; Other 2 dimensions: 3.1 x 2.2 cm, previously, 3.6 x 2.9 x 1.9 cm Composition: solid/almost completely solid (2) Echogenicity: hypoechoic (2) Shape: not taller-than-wide (0) Margins: smooth (0) Echogenic foci: none (0) ACR TI-RADS total points: 4. ACR TI-RADS risk category:  TR4 (4-6 points). Significant change in size (>/= 20% in two dimensions and minimal increase of 2 mm): Change in features: No Change in ACR TI-RADS risk category: No ACR TI-RADS recommendations: **Given size (>/= 1.5 cm) and appearance, fine needle aspiration of this moderately suspicious nodule should be considered based on TI-RADS criteria. _________________________________________________________ IMPRESSION: 1. Thyromegaly with bilateral nodules as above. The above is in keeping with the ACR TI-RADS recommendations - J Am Coll Radiol 2017;14:587-595. Electronically Signed   By: Lucrezia Europe M.D.   On: 07/09/2018 09:19   Vas [place] Lower Extremity Venous (dvt)  Result Date: 07/10/2018  Lower Venous Study Indications: Swelling.  Limitations: Body habitus, poor ultrasound/tissue interface and patient unable to cooperate. Poor patient position. Comparison Study: No prior study Performing Technologist: Maudry Mayhew MHA, RDMS, RVT, RDCS  Examination Guidelines: A complete evaluation includes B-mode imaging, spectral Doppler, color Doppler, and power Doppler  as needed of all accessible portions of each vessel. Bilateral testing is considered an integral part of a complete examination. Limited examinations for reoccurring indications may be performed as noted.  +---------+---------------+---------+-----------+----------+-------+  RIGHT     Compressibility Phasicity Spontaneity Properties Summary  +---------+---------------+---------+-----------+----------+-------+  CFV       Full            Yes       Yes                             +---------+---------------+---------+-----------+----------+-------+  SFJ       Full                                                      +---------+---------------+---------+-----------+----------+-------+  FV Prox   Full                                                      +---------+---------------+---------+-----------+----------+-------+  FV Mid    Full                                                      +---------+---------------+---------+-----------+----------+-------+  FV Distal Full                                                      +---------+---------------+---------+-----------+----------+-------+  PFV       Full                                                      +---------+---------------+---------+-----------+----------+-------+  POP       Full            Yes       Yes                             +---------+---------------+---------+-----------+----------+-------+  PTV       Full                                                      +---------+---------------+---------+-----------+----------+-------+  PERO      Full                                                      +---------+---------------+---------+-----------+----------+-------+   +---------+---------------+---------+-----------+----------+--------------+  LEFT      Compressibility Phasicity Spontaneity Properties Summary         +---------+---------------+---------+-----------+----------+--------------+  CFV       Full            Yes       Yes                                     +---------+---------------+---------+-----------+----------+--------------+  SFJ       Full                                                             +---------+---------------+---------+-----------+----------+--------------+  FV Prox   Full                                                             +---------+---------------+---------+-----------+----------+--------------+  FV Mid    Full                                                             +---------+---------------+---------+-----------+----------+--------------+  FV Distal                                                  Not visualized  +---------+---------------+---------+-----------+----------+--------------+  PFV  Full                                                             +---------+---------------+---------+-----------+----------+--------------+  POP       Full            Yes       Yes                                    +---------+---------------+---------+-----------+----------+--------------+  PTV                                                        Not visualized  +---------+---------------+---------+-----------+----------+--------------+  PERO                                                       Not visualized  +---------+---------------+---------+-----------+----------+--------------+     Summary: Right: There is no evidence of deep vein thrombosis in the lower extremity. No cystic structure found in the popliteal fossa. Left: There is no evidence of deep vein thrombosis in the lower extremity. However, portions of this examination were limited- see technologist comments above. No cystic structure found in the popliteal fossa.  *See table(s) above for measurements and observations. Electronically signed by Servando Snare MD on 07/10/2018 at 8:27:59 AM.    Final         Scheduled Meds:  Continuous Infusions:    LOS: 4 days   The patient is critically ill with multiple organ systems failure and requires high  complexity decision making for assessment and support, frequent evaluation and titration of therapies, application of advanced monitoring technologies and extensive interpretation of multiple databases. Critical Care Time devoted to patient care services described in this note  Time spent: 40 minutes     Keeon Zurn, Geraldo Docker, MD [facility] Pager [phone]  If 7PM-7AM, please contact night-coverage [email or web address] Password [facility] 07/10/2018, 9:21 AM

## 2018-07-11 LAB — PTH-RELATED PEPTIDE: PTH-related peptide: <2 pmol/L

## 2018-07-29 DEATH — deceased

## 2019-07-23 IMAGING — CT CT HEAD W/O CM
3 series · 15 of 47 positions shown, 18 images · non-contrast
Comparison: None.

CLINICAL DATA: 89-year-old with focal neuro deficit. Sudden visual
loss in both eyes.

EXAM:
CT HEAD WITHOUT CONTRAST
TECHNIQUE: Contiguous axial images were obtained from the base of the skull
through the vertex without intravenous contrast.

[Series 3: head 5.0 h30s · axial · 0.42mm/px · z∈[-92,+43]mm · 9 of 33 slices shown, 12 images]
[im 3/33  brain]
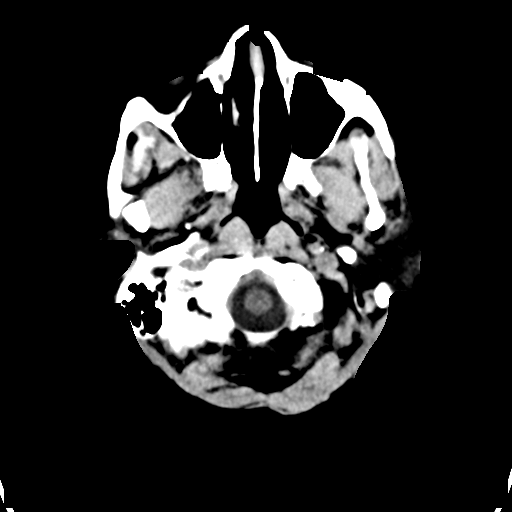
[im 3/33  bone]
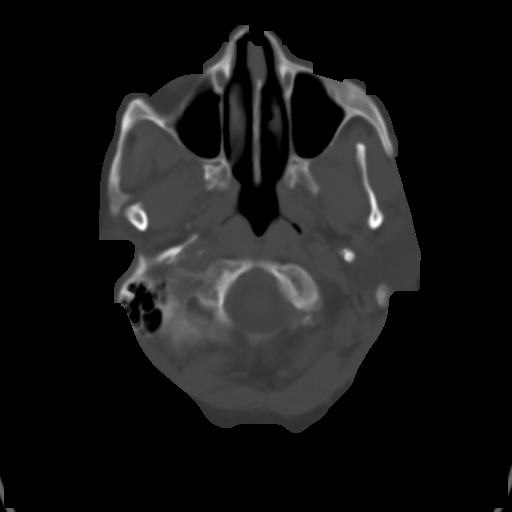
[im 6/33  brain]
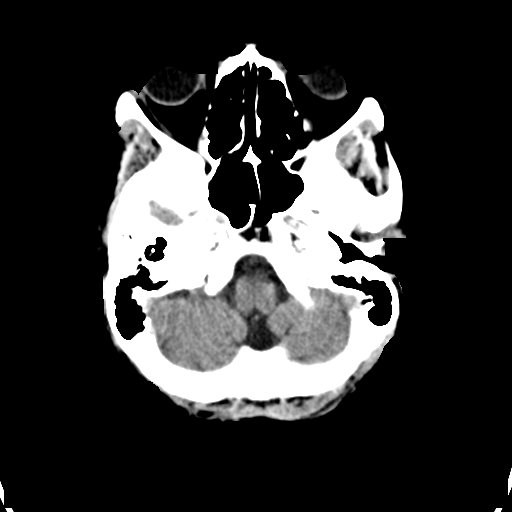
[im 9/33  brain]
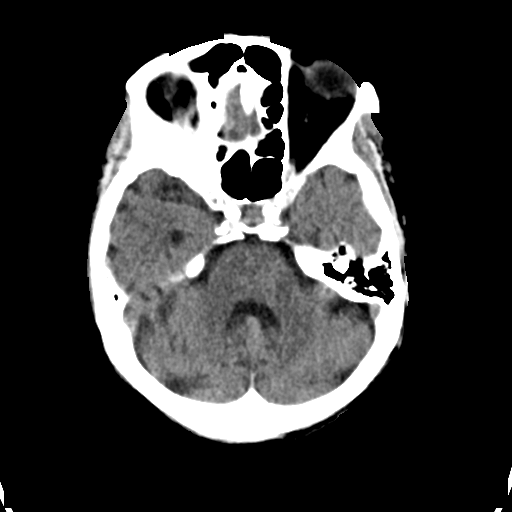
[im 13/33  brain]
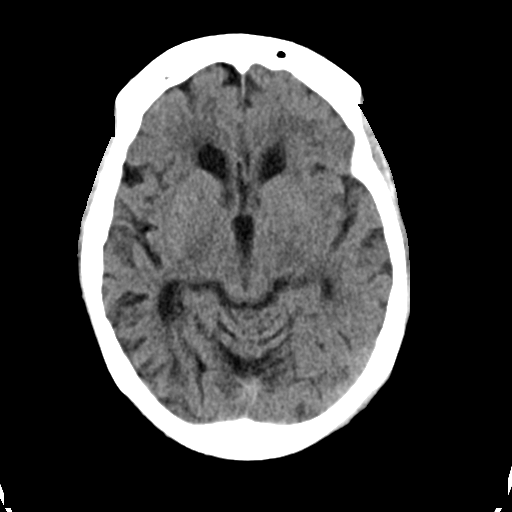
[im 17/33  brain]
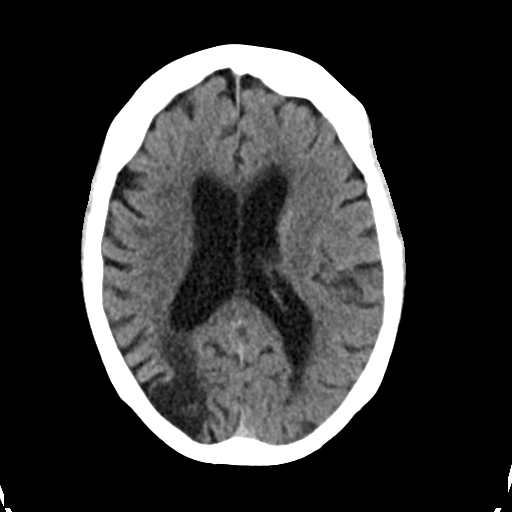
[im 17/33  bone]
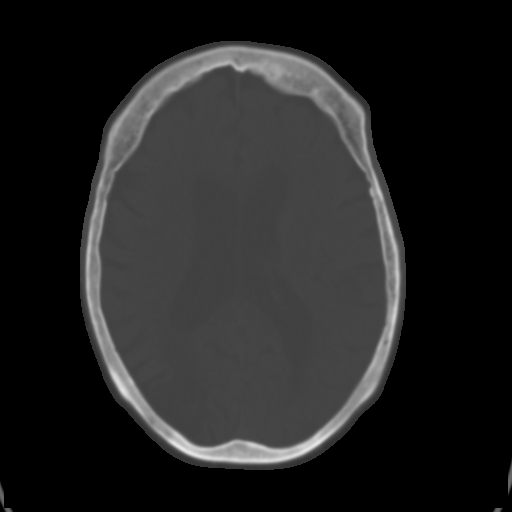
[im 20/33  brain]
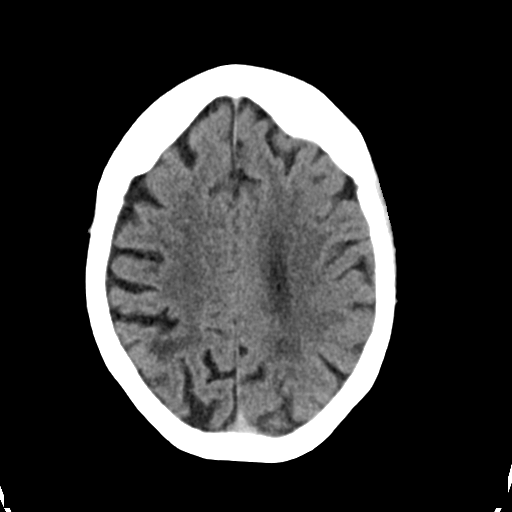
[im 24/33  brain]
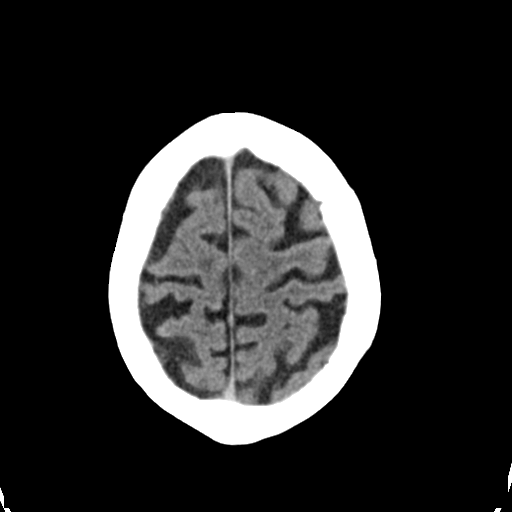
[im 27/33  brain]
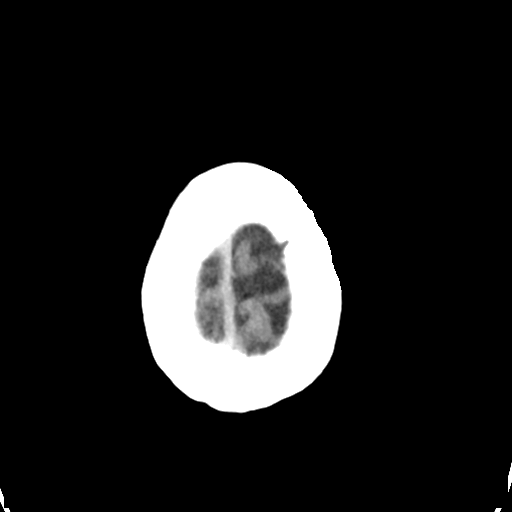
[im 30/33  brain]
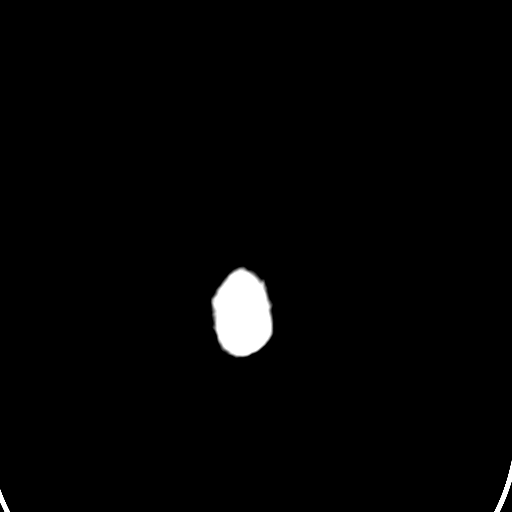
[im 30/33  bone]
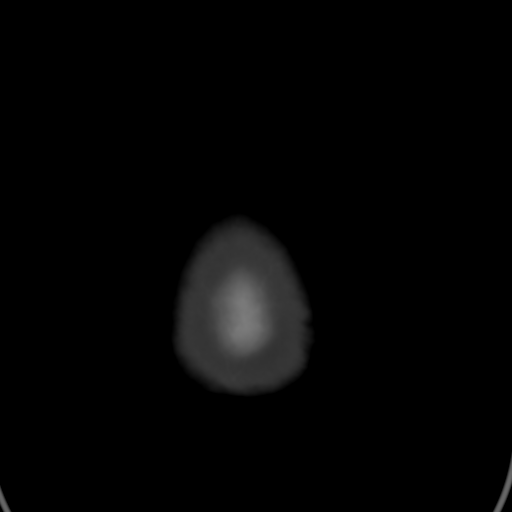

[Series 5: head 3.0 mpr cor · coronal · 0.32mm/px · 3 of 68 slices shown]
[im 23/68  brain]
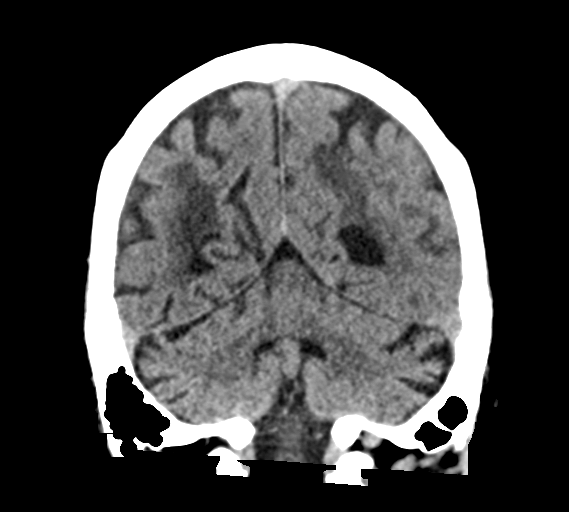
[im 30/68  brain]
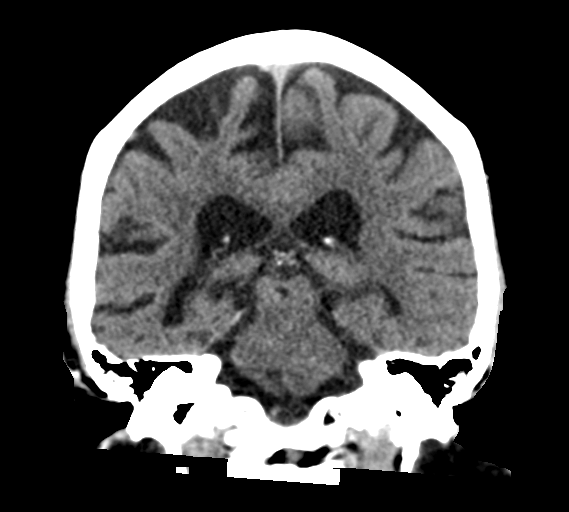
[im 38/68  brain]
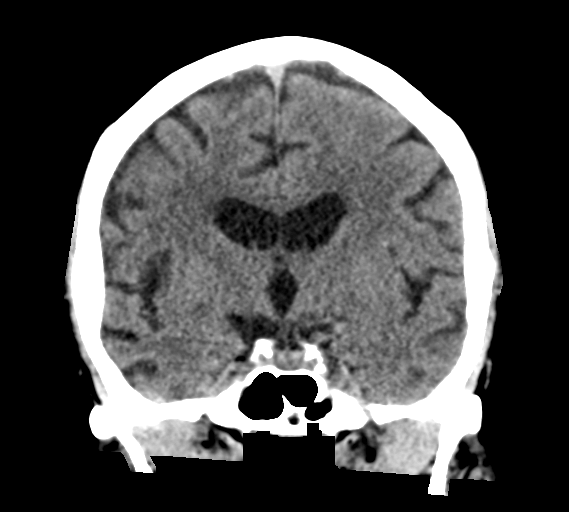

[Series 6: head 3.0 mpr sag · sagittal · 0.32mm/px · 3 of 59 slices shown]
[im 20/59  brain]
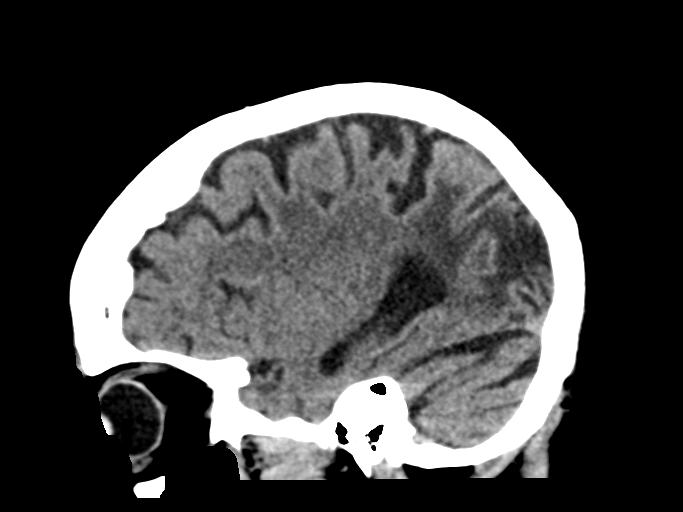
[im 30/59  brain]
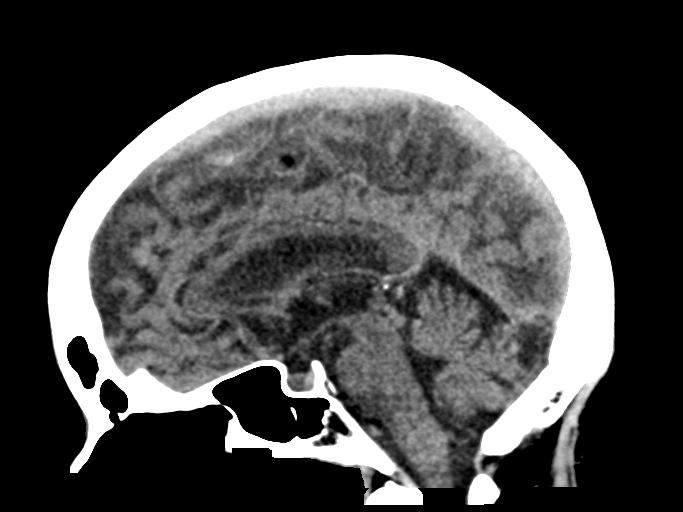
[im 39/59  brain]
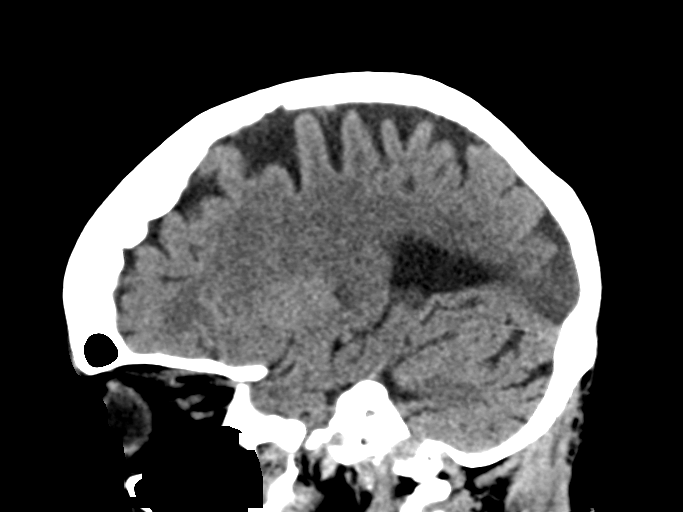

[15 of 47 positions shown; findings below may reference images not displayed]

FINDINGS: Brain: Negative for acute hemorrhage, mass lesion, midline shift,
hydrocephalus or new large infarct. Atrophy and encephalomalacia in
the right temporal lobe. Focal encephalomalacia involving the right
parietal and right occipital lobe. Mild ex vacuo dilatation of the
right temporal horn.

Vascular: No hyperdense vessel or unexpected calcification.

Skull: Normal. Negative for fracture or focal lesion.

Sinuses/Orbits: Negative

Other: Evidence for a scalp lipoma along the left lateral forehead.
IMPRESSION: No acute intracranial abnormality.

Old right cerebral infarct.

## 2020-08-11 IMAGING — US US RENAL
1 series · 14 of 25 positions shown · non-contrast
Comparison: None.

CLINICAL DATA: Acute kidney injury

EXAM:
RENAL / URINARY TRACT ULTRASOUND COMPLETE

[Series 1: us renal · 14 of 34 slices shown]
[im 1/34]
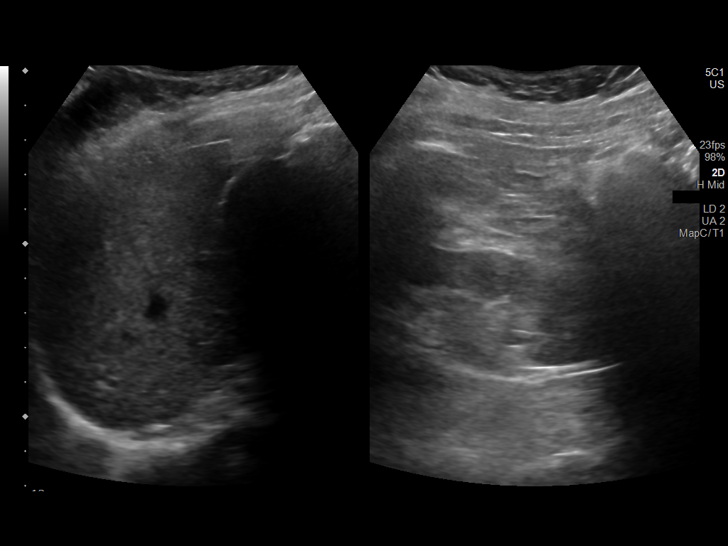
[im 3/34]
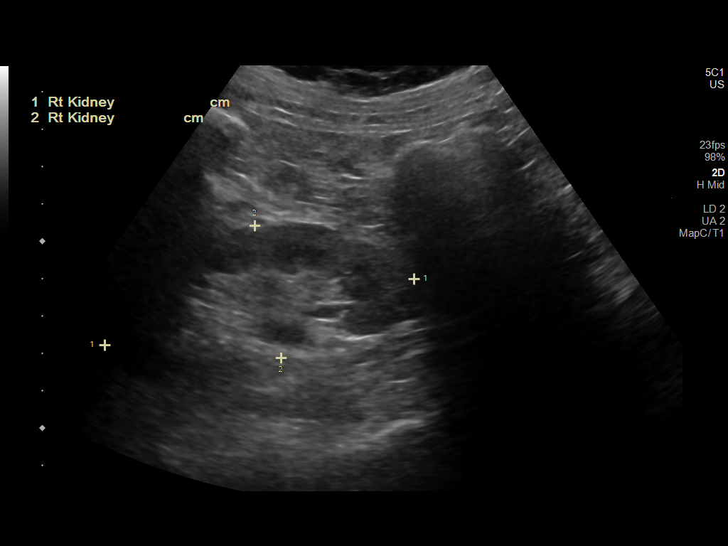
[im 6/34]
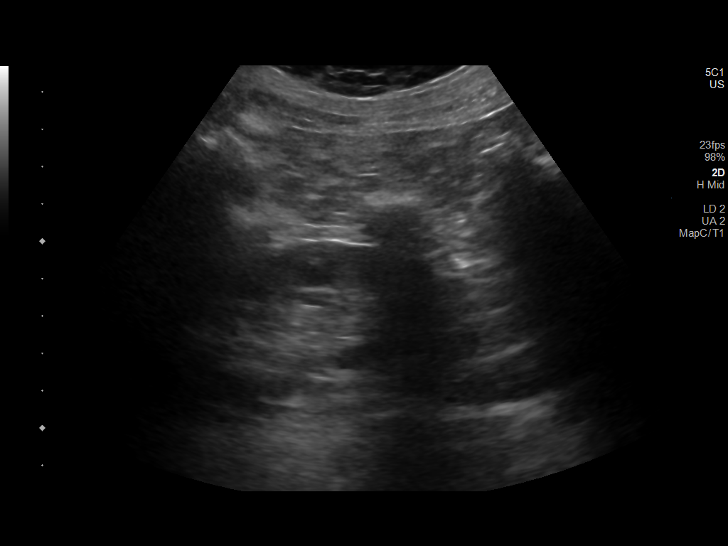
[im 9/34]
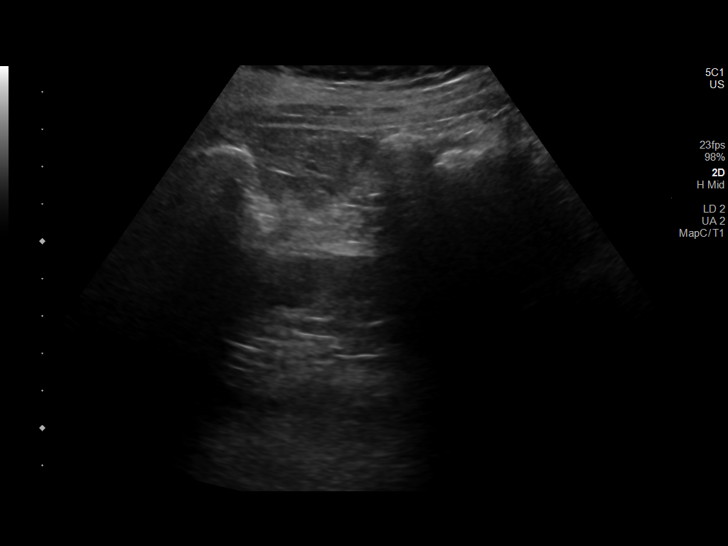
[im 12/34]
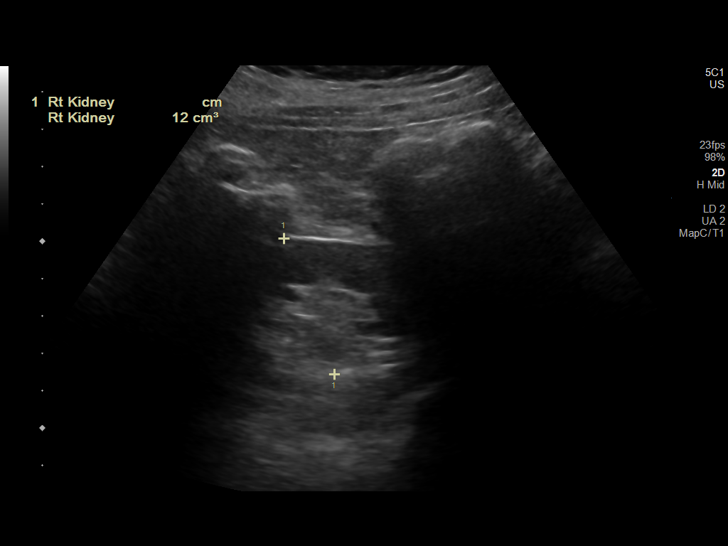
[im 13/34]
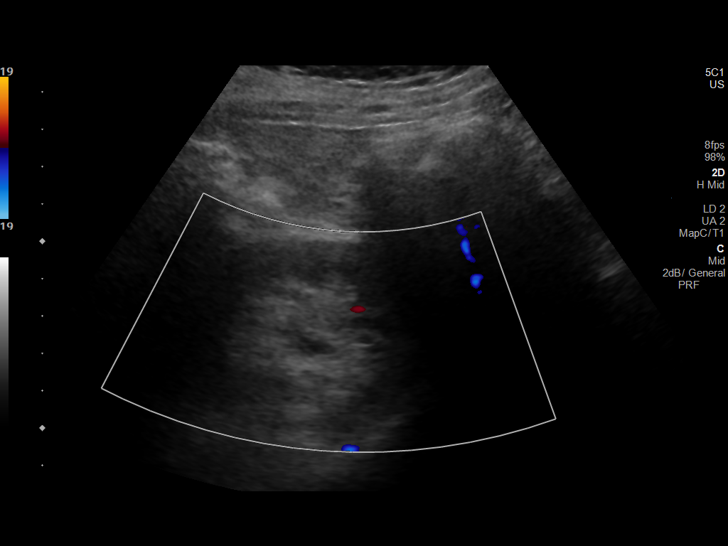
[im 16/34]
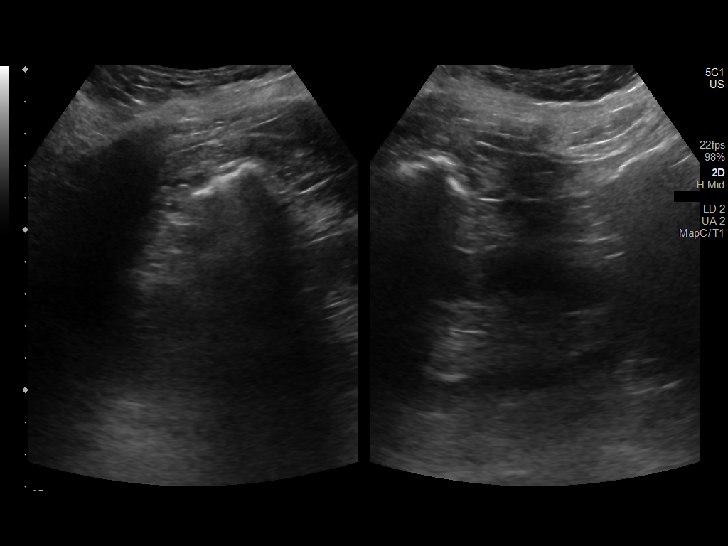
[im 18/34]
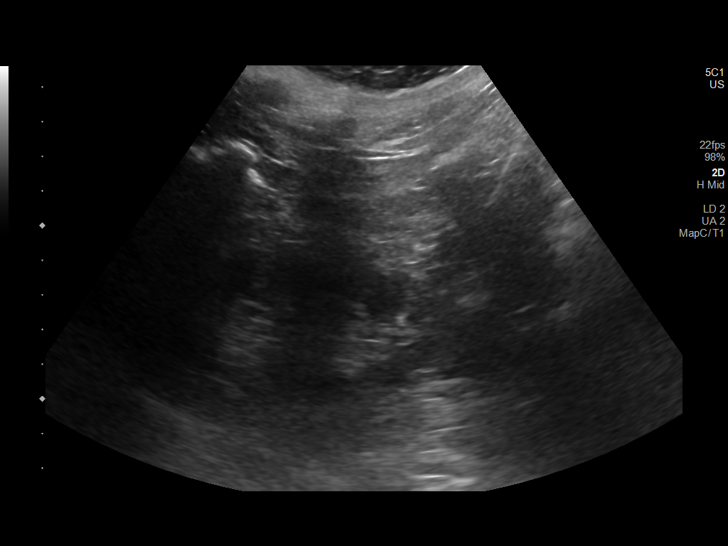
[im 21/34]
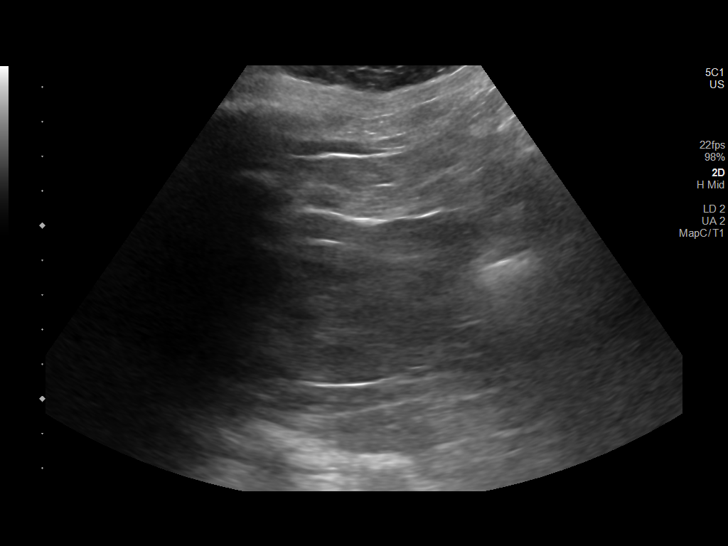
[im 23/34]
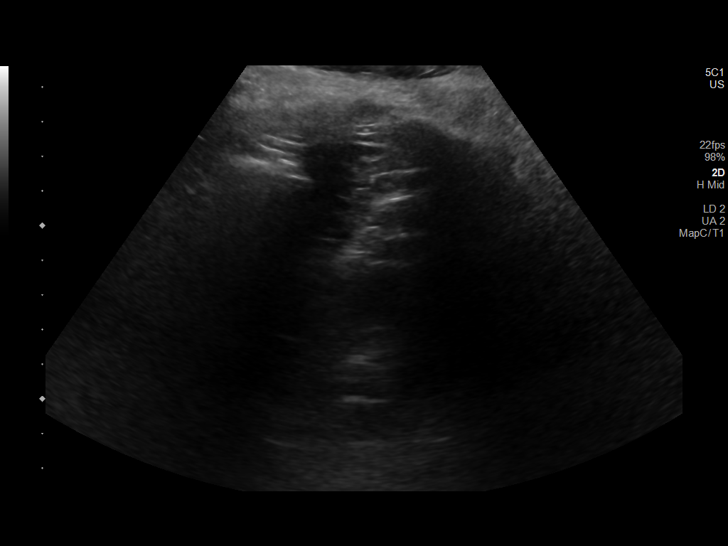
[im 25/34]
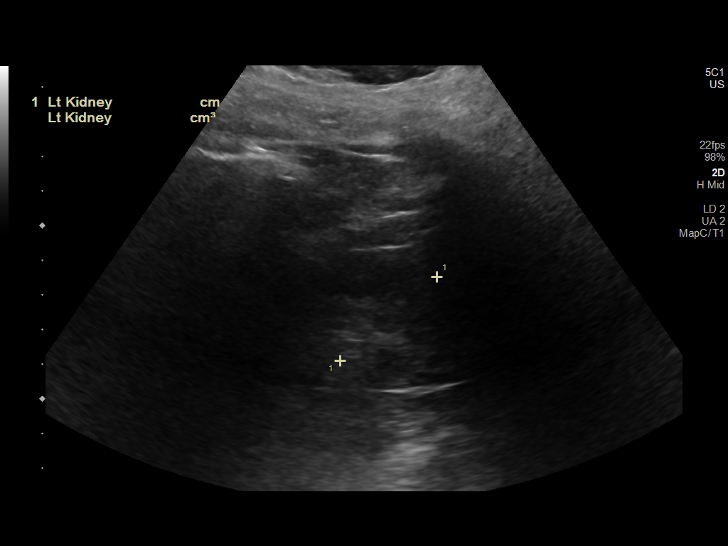
[im 28/34]
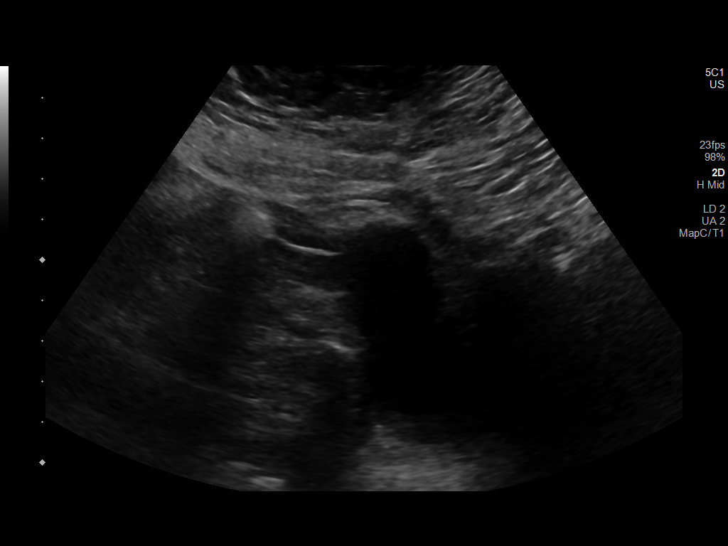
[im 31/34]
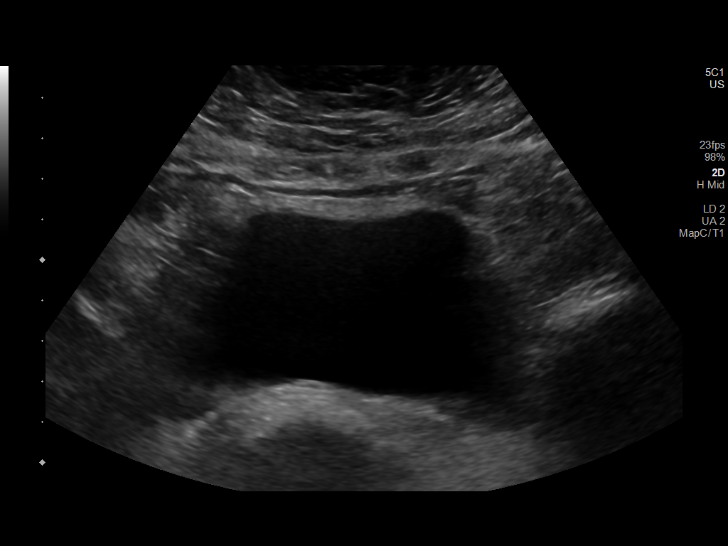
[im 34/34]
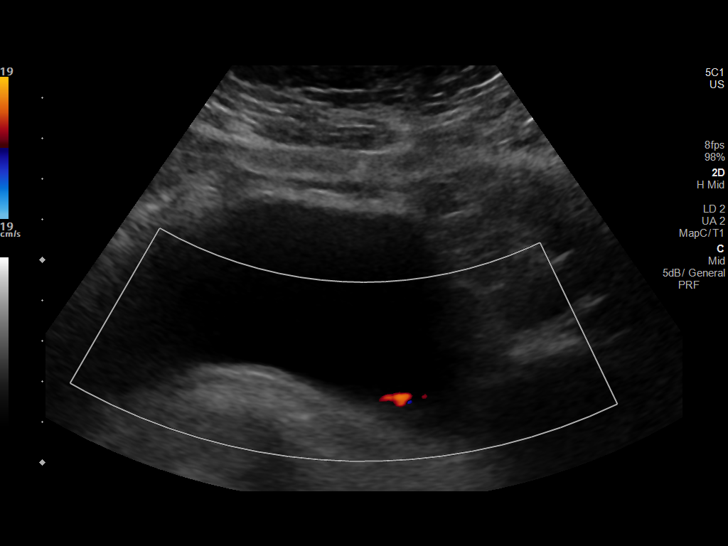

[14 of 25 positions shown; findings below may reference images not displayed]

FINDINGS: Right Kidney:

Renal measurements: 8.5 x 3.6 x 3.9 cm = volume: 62 mL .
Echogenicity within normal limits. No mass or hydronephrosis
visualized.

Left Kidney:

Renal measurements: 8.9 x 4.7 x 3.7 cm = volume: 79 mL. Echogenicity
within normal limits. No mass or hydronephrosis visualized.

Bladder:

Appears normal for degree of bladder distention. Both ureteral jets
were visualized.
IMPRESSION: No acute sonographic abnormality. No specific abnormality to explain
the patient's acute kidney injury.

## 2020-08-11 IMAGING — DX PORTABLE CHEST - 1 VIEW
1 series · 1 of 1 positions shown · non-contrast
Comparison: 09/25/2014.

CLINICAL DATA: Weakness and fatigue has been getting severely worse
for past 3 weeks

EXAM:
PORTABLE CHEST - 1 VIEW

[chest ap]
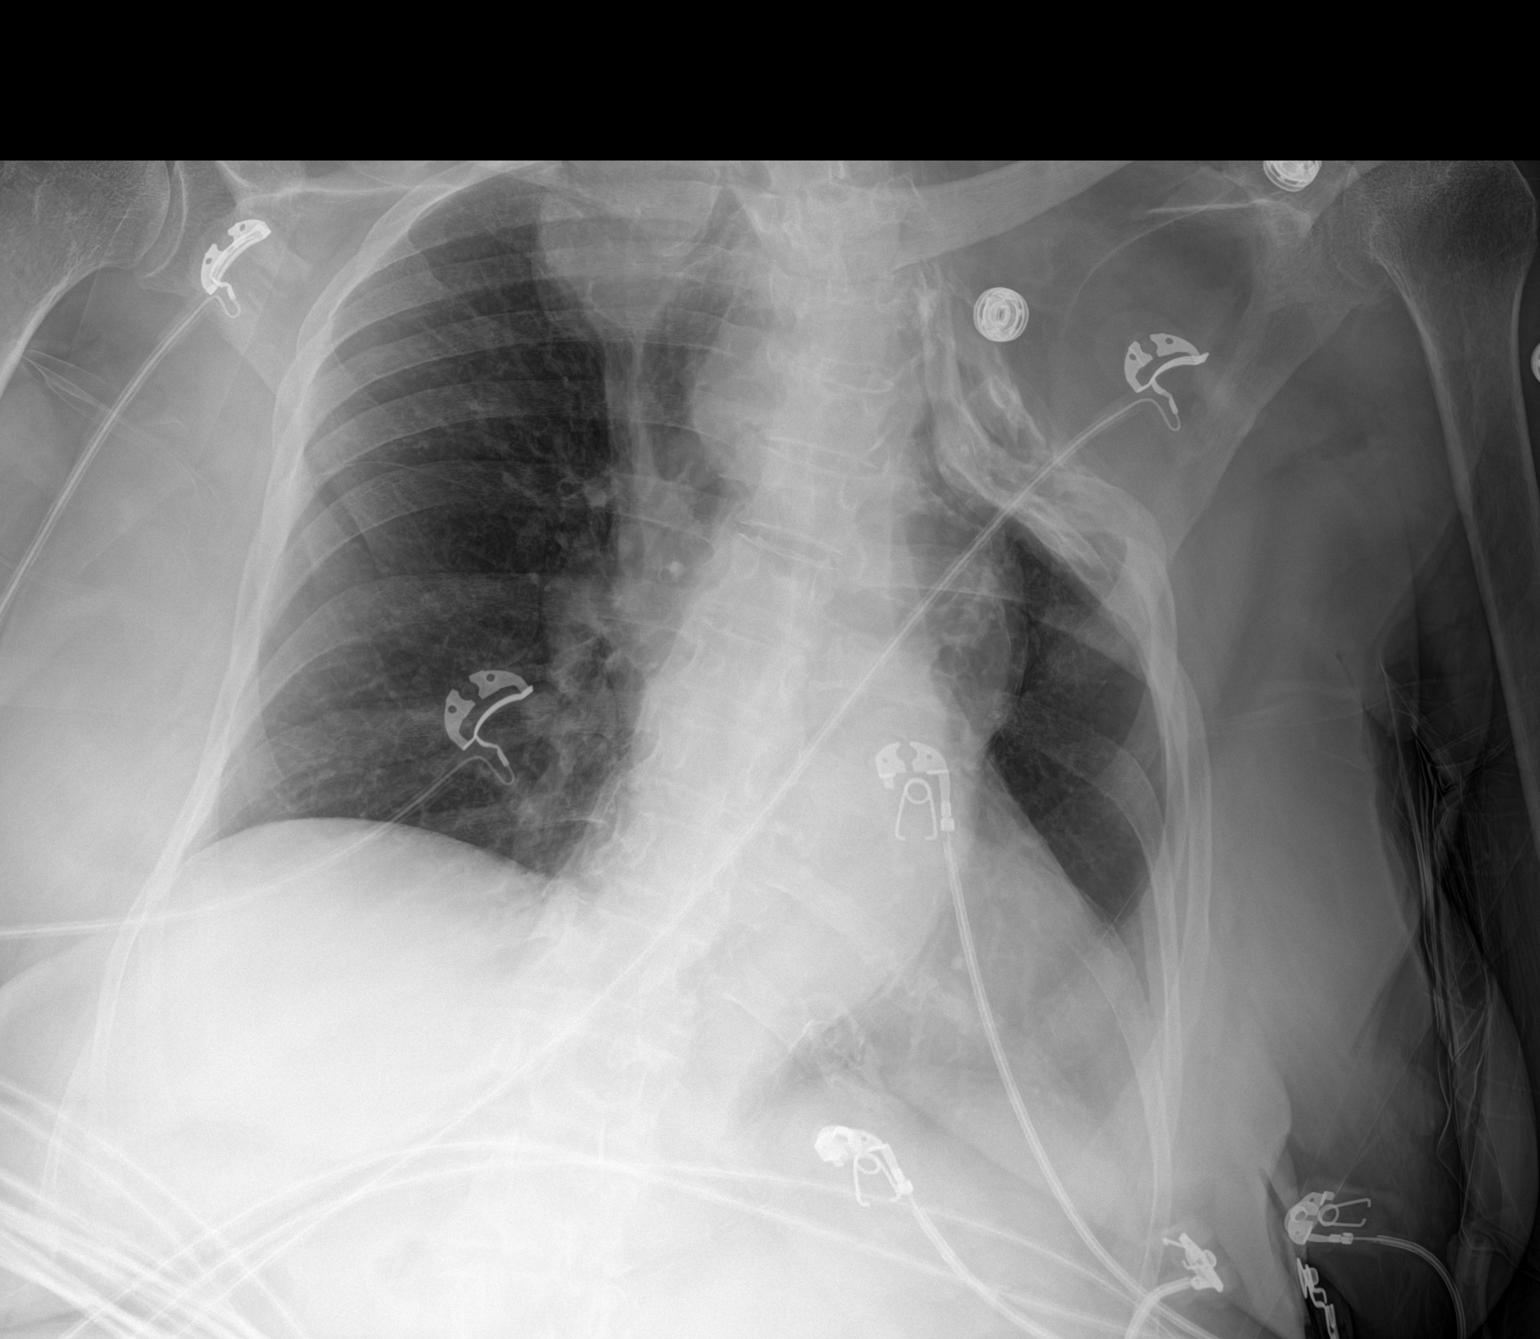

[1 of 1 positions shown; findings below may reference images not displayed]

FINDINGS: Chronic changes of left upper thoracoplasty. Progressive right
paratracheal substernal goiter. No pulmonary infiltrate or edema.
Heart size normal.

No effusion.
IMPRESSION: No acute cardiopulmonary disease.
